# Patient Record
Sex: Male | Born: 1965 | Race: Black or African American | Hispanic: No | Marital: Married | Smoking: Current every day smoker
Health system: Southern US, Community
[De-identification: ages and names within clinical notes are randomized; demographics above are authoritative.]

## PROBLEM LIST (undated history)

## (undated) HISTORY — PX: TONSILLECTOMY: SUR1361

## (undated) HISTORY — PX: HIP SURGERY: SHX245

---

## 2000-02-29 ENCOUNTER — Emergency Department (HOSPITAL_COMMUNITY): Admission: EM | Admit: 2000-02-29 | Discharge: 2000-02-29 | Payer: Self-pay | Admitting: *Deleted

## 2007-09-09 ENCOUNTER — Emergency Department (HOSPITAL_COMMUNITY): Admission: EM | Admit: 2007-09-09 | Discharge: 2007-09-09 | Payer: Self-pay | Admitting: Emergency Medicine

## 2008-07-08 ENCOUNTER — Emergency Department (HOSPITAL_COMMUNITY): Admission: EM | Admit: 2008-07-08 | Discharge: 2008-07-09 | Payer: Self-pay | Admitting: Emergency Medicine

## 2009-01-14 ENCOUNTER — Emergency Department (HOSPITAL_COMMUNITY): Admission: EM | Admit: 2009-01-14 | Discharge: 2009-01-14 | Payer: Self-pay | Admitting: Emergency Medicine

## 2010-01-09 ENCOUNTER — Emergency Department (HOSPITAL_COMMUNITY): Admission: EM | Admit: 2010-01-09 | Discharge: 2009-03-25 | Payer: Self-pay | Admitting: Emergency Medicine

## 2010-03-15 ENCOUNTER — Emergency Department (HOSPITAL_COMMUNITY): Payer: Managed Care, Other (non HMO)

## 2010-03-15 ENCOUNTER — Emergency Department (HOSPITAL_COMMUNITY)
Admission: EM | Admit: 2010-03-15 | Discharge: 2010-03-15 | Disposition: A | Discharge status: Home / Self Care | Payer: Managed Care, Other (non HMO) | Attending: Emergency Medicine | Admitting: Emergency Medicine

## 2010-03-15 DIAGNOSIS — F17200 Nicotine dependence, unspecified, uncomplicated: Secondary | ICD-10-CM | POA: Insufficient documentation

## 2010-03-15 DIAGNOSIS — W2209XA Striking against other stationary object, initial encounter: Secondary | ICD-10-CM | POA: Insufficient documentation

## 2010-03-15 DIAGNOSIS — S92919A Unspecified fracture of unspecified toe(s), initial encounter for closed fracture: Secondary | ICD-10-CM | POA: Insufficient documentation

## 2010-12-15 ENCOUNTER — Emergency Department (HOSPITAL_COMMUNITY)
Admission: EM | Admit: 2010-12-15 | Discharge: 2010-12-15 | Disposition: A | Discharge status: Home / Self Care | Payer: Managed Care, Other (non HMO) | Attending: Emergency Medicine | Admitting: Emergency Medicine

## 2010-12-15 ENCOUNTER — Emergency Department (HOSPITAL_COMMUNITY): Payer: Managed Care, Other (non HMO)

## 2010-12-15 ENCOUNTER — Other Ambulatory Visit: Payer: Self-pay

## 2010-12-15 ENCOUNTER — Encounter: Payer: Self-pay | Admitting: Emergency Medicine

## 2010-12-15 DIAGNOSIS — R531 Weakness: Secondary | ICD-10-CM

## 2010-12-15 DIAGNOSIS — R51 Headache: Secondary | ICD-10-CM | POA: Insufficient documentation

## 2010-12-15 DIAGNOSIS — R5381 Other malaise: Secondary | ICD-10-CM | POA: Insufficient documentation

## 2010-12-15 DIAGNOSIS — F17200 Nicotine dependence, unspecified, uncomplicated: Secondary | ICD-10-CM | POA: Insufficient documentation

## 2010-12-15 DIAGNOSIS — R079 Chest pain, unspecified: Secondary | ICD-10-CM | POA: Insufficient documentation

## 2010-12-15 DIAGNOSIS — R110 Nausea: Secondary | ICD-10-CM | POA: Insufficient documentation

## 2010-12-15 LAB — BASIC METABOLIC PANEL
CO2: 26 mEq/L (ref 19–32)
Calcium: 9.2 mg/dL (ref 8.4–10.5)
Chloride: 106 mEq/L (ref 96–112)
Creatinine, Ser: 1.24 mg/dL (ref 0.50–1.35)
Glucose, Bld: 112 mg/dL — ABNORMAL HIGH (ref 70–99)
Potassium: 3.5 mEq/L (ref 3.5–5.1)

## 2010-12-15 LAB — CBC
HCT: 42.1 % (ref 39.0–52.0)
RBC: 4.52 MIL/uL (ref 4.22–5.81)
WBC: 5.2 10*3/uL (ref 4.0–10.5)

## 2010-12-15 MED ORDER — ASPIRIN 81 MG PO CHEW
324.0000 mg | CHEWABLE_TABLET | Freq: Once | ORAL | Status: AC
  Administered 2010-12-15: 324 mg via ORAL
  Filled 2010-12-15: qty 4

## 2010-12-15 NOTE — ED Notes (Signed)
Pt c/o feeling weakness and states his head/legs and chest feel funny.

## 2010-12-15 NOTE — ED Provider Notes (Signed)
History     CSN: [id number] Arrival date & time: 12/15/2010  6:42 PM   First MD Initiated Contact with Patient 12/15/10 1929      Chief Complaint  Patient presents with  . Chest Pain  . Weakness     Patient is a 45 y.o. male presenting with weakness. The history is provided by the patient and a relative.  Weakness The primary symptoms include nausea. Primary symptoms do not include headaches, syncope, loss of consciousness, altered mental status, seizures, dizziness, visual change, paresthesias, focal weakness, loss of sensation, speech change, memory loss, fever or vomiting. The symptoms began 6 to 12 hours ago. The symptoms are improving. The neurological symptoms are diffuse.  Additional symptoms include weakness.   Pt reports he was hunting today, and "legs felt heavy" but no syncope No focal weakness.  He felt mild nausea and diffuse weakness. No actual CP - report "chest feels funny" but is unable to fully describe this No SOB reported No vomiting He felt "clammy" Now feeling improved He is not on meds No recent vomiting/diarrhea/illnesses   PMH - none  History reviewed. No pertinent past surgical history.  History reviewed. No pertinent family history.  History  Substance Use Topics  . Smoking status: Current Everyday Smoker    Types: Cigarettes  . Smokeless tobacco: Not on file  . Alcohol Use: No      Review of Systems  Constitutional: Negative for fever.  Cardiovascular: Negative for syncope.  Gastrointestinal: Positive for nausea. Negative for vomiting.  Neurological: Positive for weakness. Negative for dizziness, speech change, focal weakness, seizures, loss of consciousness, headaches and paresthesias.  Psychiatric/Behavioral: Negative for memory loss and altered mental status.  All other systems reviewed and are negative.    Allergies  Penicillins  Home Medications  No current outpatient prescriptions on file.  BP 114/74  Pulse 75   Temp(Src) 97.5 F (36.4 C) (Oral)  Resp 20  SpO2 97% BP 118/74  Pulse 68  Temp(Src) 97.5 F (36.4 C) (Oral)  Resp 20  SpO2 98%   Physical Exam  CONSTITUTIONAL: Well developed/well nourished HEAD AND FACE: Normocephalic/atraumatic EYES: EOMI/PERRL ENMT: Mucous membranes moist NECK: supple no meningeal signs CV: S1/S2 noted, no murmurs/rubs/gallops noted LUNGS: Lungs are clear to auscultation bilaterally, no apparent distress ABDOMEN: soft, nontender, no rebound or guarding GU:no cva tenderness NEURO: Pt is awake/alert, moves all extremitiesx4.  Pt watching Tv, no distress Face symmetric, no arm/leg drift EXTREMITIES: pulses normal, full ROM SKIN: warm, color normal PSYCH: no abnormalities of mood noted    ED Course  Procedures   Labs Reviewed  POCT I-STAT TROPONIN I  CBC  BASIC METABOLIC PANEL  POCT CARDIAC MARKERS  PRO B NATRIURETIC PEPTIDE  CARDIAC PANEL(CRET KIN+CKTOT+MB+TROPI)   Dg Chest Port 1 View  12/15/2010  *RADIOLOGY REPORT*  Clinical Data: Chest pain.  Weakness.  Headache.  PORTABLE CHEST - 1 VIEW  Comparison: 03/25/2009.  Findings: Interval borderline enlargement of the cardiac silhouette, magnified by the poor inspiration and the portable AP technique.  Clear lungs with normal vascularity.  No significant change in mildly prominent interstitial markings.  Unremarkable bones.  IMPRESSION: Stable mild chronic interstitial lung disease.  No acute abnormality.  Original Report Authenticated By: Darrol Angel, M.D.     9:31 PM Pt with complaints of generalized weakness, now improved, no syncope.  No signs of CVA.  No significant CP reported.  9:56 PM I wanted to repeat his cardiac markers before discharge Pt refused to stay  for any further testing and wanted to leave immediately  MDM  Nursing notes reviewed and considered in documentation All labs/vitals reviewed and considered xrays reviewed and considered    Date: 12/15/2010  Rate: 60  Rhythm:  normal sinus rhythm  QRS Axis: normal  Intervals: normal  ST/T Wave abnormalities: normal  Conduction Disutrbances:none  Narrative Interpretation:   Old EKG Reviewed: none available at time of interpretation        Joya Gaskins, MD 12/15/10 2156

## 2011-02-11 ENCOUNTER — Emergency Department (HOSPITAL_COMMUNITY)
Admission: EM | Admit: 2011-02-11 | Discharge: 2011-02-12 | Disposition: A | Discharge status: Home / Self Care | Payer: Managed Care, Other (non HMO) | Attending: Emergency Medicine | Admitting: Emergency Medicine

## 2011-02-11 ENCOUNTER — Encounter (HOSPITAL_COMMUNITY): Payer: Self-pay | Admitting: *Deleted

## 2011-02-11 DIAGNOSIS — R112 Nausea with vomiting, unspecified: Secondary | ICD-10-CM | POA: Insufficient documentation

## 2011-02-11 DIAGNOSIS — R10816 Epigastric abdominal tenderness: Secondary | ICD-10-CM | POA: Insufficient documentation

## 2011-02-11 DIAGNOSIS — R109 Unspecified abdominal pain: Secondary | ICD-10-CM | POA: Insufficient documentation

## 2011-02-11 DIAGNOSIS — R197 Diarrhea, unspecified: Secondary | ICD-10-CM | POA: Insufficient documentation

## 2011-02-11 MED ORDER — PANTOPRAZOLE SODIUM 40 MG IV SOLR
40.0000 mg | Freq: Once | INTRAVENOUS | Status: AC
  Administered 2011-02-12: 40 mg via INTRAVENOUS
  Filled 2011-02-11: qty 40

## 2011-02-11 MED ORDER — SODIUM CHLORIDE 0.9 % IV BOLUS (SEPSIS)
1000.0000 mL | Freq: Once | INTRAVENOUS | Status: AC
  Administered 2011-02-12: 1000 mL via INTRAVENOUS

## 2011-02-11 MED ORDER — ONDANSETRON HCL 4 MG/2ML IJ SOLN
4.0000 mg | Freq: Once | INTRAMUSCULAR | Status: AC
Start: 2011-02-12 — End: 2011-02-12
  Administered 2011-02-12: 4 mg via INTRAVENOUS
  Filled 2011-02-11: qty 2

## 2011-02-11 NOTE — ED Notes (Signed)
Pt reports n/v/d starting yesterday 

## 2011-02-12 ENCOUNTER — Encounter (HOSPITAL_COMMUNITY): Payer: Self-pay | Admitting: Emergency Medicine

## 2011-02-12 LAB — DIFFERENTIAL
Basophils Relative: 0 % (ref 0–1)
Lymphs Abs: 0.8 10*3/uL (ref 0.7–4.0)
Monocytes Relative: 7 % (ref 3–12)
Neutro Abs: 6.7 10*3/uL (ref 1.7–7.7)
Neutrophils Relative %: 82 % — ABNORMAL HIGH (ref 43–77)

## 2011-02-12 LAB — COMPREHENSIVE METABOLIC PANEL
ALT: 33 U/L (ref 0–53)
CO2: 29 mEq/L (ref 19–32)
Calcium: 10 mg/dL (ref 8.4–10.5)
Chloride: 100 mEq/L (ref 96–112)
GFR calc Af Amer: 82 mL/min — ABNORMAL LOW (ref >90)
Potassium: 3.5 mEq/L (ref 3.5–5.1)
Sodium: 136 mEq/L (ref 135–145)
Total Bilirubin: 1.4 mg/dL — ABNORMAL HIGH (ref 0.3–1.2)

## 2011-02-12 LAB — CBC
MCH: 32.7 pg (ref 26.0–34.0)
Platelets: 161 10*3/uL (ref 150–400)

## 2011-02-12 LAB — LIPASE, BLOOD: Lipase: 14 U/L (ref 11–59)

## 2011-02-12 MED ORDER — ONDANSETRON HCL 8 MG PO TABS: 8.0000 mg | ORAL_TABLET | Freq: Four times a day (QID) | ORAL | Status: AC

## 2011-02-12 NOTE — ED Notes (Signed)
NS 1 liter complete at this time.

## 2011-02-12 NOTE — ED Provider Notes (Signed)
Medical screening examination/treatment/procedure(s) were performed by non-physician practitioner and as supervising physician I was immediately available for consultation/collaboration.   Lyanne Co, MD 02/12/11 [phone]

## 2011-02-12 NOTE — ED Provider Notes (Signed)
History     CSN: [id number]  Arrival date & time 02/11/11  2241   First MD Initiated Contact with Patient 02/11/11 2323      Chief Complaint  Patient presents with  . Abdominal Pain    (Consider location/radiation/quality/duration/timing/severity/associated sxs/prior treatment) HPI Comments: Patient c/o gradual onset of "crampy" upper abdominal pain that began last evening.  He later developed diarrhea and vomiting that has persisted today.  States that he only vomits when he tries to eat or drink.  States that co-workers also have similar symptoms.  He denies urinary symptoms, bloody diarrhea or vomitus, fever or chest pain  Patient is a 46 y.o. male presenting with abdominal pain. The history is provided by the patient. No language interpreter was used.  Abdominal Pain The primary symptoms of the illness include abdominal pain, nausea, vomiting and diarrhea. The primary symptoms of the illness do not include fever, shortness of breath, hematemesis or dysuria. The current episode started yesterday. The onset of the illness was gradual. The problem has not changed since onset. The abdominal pain began yesterday. The pain came on gradually. The abdominal pain has been gradually improving since its onset. The abdominal pain is located in the epigastric region. The abdominal pain does not radiate. The abdominal pain is relieved by vomiting and bowel movement. The abdominal pain is exacerbated by eating.  Nausea began yesterday. Associated with: nothing. The nausea is exacerbated by food.  The vomiting began yesterday. Vomiting occurs 2 to 5 times per day. The emesis contains stomach contents.  The diarrhea began yesterday. The diarrhea is watery. The diarrhea occurs 2 to 4 times per day.  Associated with: sick contacts. The patient has had a change in bowel habit. Symptoms associated with the illness do not include anorexia, diaphoresis, heartburn, constipation, urgency, hematuria, frequency or  back pain. Significant associated medical issues do not include PUD, GERD, inflammatory bowel disease, diabetes, gallstones, substance abuse or diverticulitis.    History reviewed. No pertinent past medical history.  Past Surgical History  Procedure Date  . Hip surgery     No family history on file.  History  Substance Use Topics  . Smoking status: Current Everyday Smoker    Types: Cigarettes  . Smokeless tobacco: Not on file  . Alcohol Use: No      Review of Systems  Constitutional: Negative for fever, diaphoresis, activity change and appetite change.  HENT: Negative for neck pain and neck stiffness.   Respiratory: Negative for chest tightness and shortness of breath.   Gastrointestinal: Positive for nausea, vomiting, abdominal pain and diarrhea. Negative for heartburn, constipation, blood in stool, anorexia and hematemesis.  Genitourinary: Negative for dysuria, urgency, frequency and hematuria.  Musculoskeletal: Negative for myalgias and back pain.  Skin: Negative.   Neurological: Negative for dizziness and headaches.  All other systems reviewed and are negative.    Allergies  Penicillins  Home Medications  No current outpatient prescriptions on file.  BP 119/70  Pulse 116  Temp(Src) 99.2 F (37.3 C) (Oral)  Resp 16  Ht 6\' 3"  (1.905 m)  Wt 225 lb 9.6 oz (102.331 kg)  BMI 28.20 kg/m2  SpO2 100%  Physical Exam  Nursing note and vitals reviewed. Constitutional: He is oriented to person, place, and time. He appears well-developed and well-nourished. No distress.  HENT:  Head: Normocephalic and atraumatic.  Mouth/Throat: Oropharynx is clear and moist.  Neck: Normal range of motion. Neck supple.  Cardiovascular: Normal rate, regular rhythm and normal heart sounds.  No murmur heard. Pulmonary/Chest: Effort normal and breath sounds normal. No respiratory distress.  Abdominal: Soft. He exhibits no distension and no mass. There is no hepatosplenomegaly. There is  tenderness in the epigastric area. There is no rigidity, no rebound, no guarding, no CVA tenderness and no tenderness at McBurney's point.  Musculoskeletal: He exhibits no edema and no tenderness.  Neurological: He is alert and oriented to person, place, and time. He exhibits normal muscle tone. Coordination normal.  Skin: Skin is warm and dry.    ED Course  Procedures (including critical care time)  Results for orders placed during the hospital encounter of 02/11/11  CBC      Component Value Range   WBC 8.1  4.0 - 10.5 (K/uL)   RBC 5.32  4.22 - 5.81 (MIL/uL)   Hemoglobin 17.4 (*) 13.0 - 17.0 (g/dL)   HCT 29.5  28.4 - 13.2 (%)   MCV 91.9  78.0 - 100.0 (fL)   MCH 32.7  26.0 - 34.0 (pg)   MCHC 35.6  30.0 - 36.0 (g/dL)   RDW 44.0  10.2 - 72.5 (%)   Platelets 161  150 - 400 (K/uL)  DIFFERENTIAL      Component Value Range   Neutrophils Relative 82 (*) 43 - 77 (%)   Neutro Abs 6.7  1.7 - 7.7 (K/uL)   Lymphocytes Relative 10 (*) 12 - 46 (%)   Lymphs Abs 0.8  0.7 - 4.0 (K/uL)   Monocytes Relative 7  3 - 12 (%)   Monocytes Absolute 0.6  0.1 - 1.0 (K/uL)   Eosinophils Relative 1  0 - 5 (%)   Eosinophils Absolute 0.1  0.0 - 0.7 (K/uL)   Basophils Relative 0  0 - 1 (%)   Basophils Absolute 0.0  0.0 - 0.1 (K/uL)  COMPREHENSIVE METABOLIC PANEL      Component Value Range   Sodium 136  135 - 145 (mEq/L)   Potassium 3.5  3.5 - 5.1 (mEq/L)   Chloride 100  96 - 112 (mEq/L)   CO2 29  19 - 32 (mEq/L)   Glucose, Bld 100 (*) 70 - 99 (mg/dL)   BUN 16  6 - 23 (mg/dL)   Creatinine, Ser 3.66  0.50 - 1.35 (mg/dL)   Calcium 44.0  8.4 - 10.5 (mg/dL)   Total Protein 7.4  6.0 - 8.3 (g/dL)   Albumin 4.2  3.5 - 5.2 (g/dL)   AST 25  0 - 37 (U/L)   ALT 33  0 - 53 (U/L)   Alkaline Phosphatase 87  39 - 117 (U/L)   Total Bilirubin 1.4 (*) 0.3 - 1.2 (mg/dL)   GFR calc non Af Amer 71 (*) >90 (mL/min)   GFR calc Af Amer 82 (*) >90 (mL/min)  LIPASE, BLOOD      Component Value Range   Lipase 14  11 - 59  (U/L)         MDM      Patient is alert, NAD.  Mild epigastric ttp w/o peritoneal signs.  Non-toxic appearing.    1:30 AM patient is feeling better.  Tolerating small amt of fluids.  abd remains soft.  Likely viral gastroenteritis . Vitals improved, tachycardia resolved after IVF's.  He agrees to f/u with his PMD for recheck or to return here if needed.    Pt feels improved after observation and/or treatment in ED.   Patient / Family / Caregiver understand and agree with initial ED impression and plan with expectations set  for ED visit.     Dontai Pember L. [place], [place] 02/12/11 [phone]

## 2011-08-17 ENCOUNTER — Emergency Department (HOSPITAL_COMMUNITY)
Admission: EM | Admit: 2011-08-17 | Discharge: 2011-08-17 | Disposition: A | Discharge status: Home / Self Care | Payer: Managed Care, Other (non HMO) | Attending: Emergency Medicine | Admitting: Emergency Medicine

## 2011-08-17 ENCOUNTER — Other Ambulatory Visit: Payer: Self-pay

## 2011-08-17 ENCOUNTER — Encounter (HOSPITAL_COMMUNITY): Payer: Self-pay | Admitting: *Deleted

## 2011-08-17 DIAGNOSIS — T678XXA Other effects of heat and light, initial encounter: Secondary | ICD-10-CM | POA: Insufficient documentation

## 2011-08-17 DIAGNOSIS — X30XXXA Exposure to excessive natural heat, initial encounter: Secondary | ICD-10-CM | POA: Insufficient documentation

## 2011-08-17 DIAGNOSIS — R5381 Other malaise: Secondary | ICD-10-CM | POA: Insufficient documentation

## 2011-08-17 DIAGNOSIS — R42 Dizziness and giddiness: Secondary | ICD-10-CM | POA: Insufficient documentation

## 2011-08-17 DIAGNOSIS — R079 Chest pain, unspecified: Secondary | ICD-10-CM | POA: Insufficient documentation

## 2011-08-17 DIAGNOSIS — F17200 Nicotine dependence, unspecified, uncomplicated: Secondary | ICD-10-CM | POA: Insufficient documentation

## 2011-08-17 DIAGNOSIS — T679XXA Effect of heat and light, unspecified, initial encounter: Secondary | ICD-10-CM

## 2011-08-17 LAB — POCT I-STAT, CHEM 8
Calcium, Ion: 1.22 mmol/L (ref 1.12–1.23)
Chloride: 107 mEq/L (ref 96–112)
HCT: 46 % (ref 39.0–52.0)
Sodium: 142 mEq/L (ref 135–145)

## 2011-08-17 MED ORDER — SODIUM CHLORIDE 0.9 % IV SOLN
INTRAVENOUS | Status: DC
  Administered 2011-08-17: 16:00:00 via INTRAVENOUS

## 2011-08-17 MED ORDER — SODIUM CHLORIDE 0.9 % IV BOLUS (SEPSIS)
1000.0000 mL | Freq: Once | INTRAVENOUS | Status: AC
  Administered 2011-08-17: 1000 mL via INTRAVENOUS

## 2011-08-17 MED ORDER — SODIUM CHLORIDE 0.9 % IV BOLUS (SEPSIS): 500.0000 mL | Freq: Once | INTRAVENOUS | Status: DC

## 2011-08-17 MED ORDER — ACETAMINOPHEN 325 MG PO TABS
650.0000 mg | ORAL_TABLET | Freq: Once | ORAL | Status: DC
  Filled 2011-08-17: qty 2

## 2011-08-17 NOTE — ED Notes (Signed)
Pt was chasing his dog, became very sob and hot.    Cont to feel hot and says he still feels sob. Denies chest pain.  Feels weak all over.Headache

## 2011-08-17 NOTE — ED Provider Notes (Signed)
History   This chart was scribed for Laray Anger, DO by Shari Heritage. The patient was seen in room APA01/APA01.     CSN: [id number]  Arrival date & time 08/17/11  1531   First MD Initiated Contact with Patient 08/17/11 1555      Chief Complaint  Patient presents with  . Shortness of Breath     HPI Pt was seen at 1600. MAXAMUS COLAO is a 46 y.o. male who presents to the Emergency Department complaining of gradual onset and persistence of constant heat exposure that began PTA.  Pt states his dog escaped his fencing in the yard and he was "chasing him around the neighborhood" for more than 1 hour in the hot sun.  States he felt "hot," "weak all over," SOB, "sweaty" with a dull headache.  States he is starting to "feel a little better" with sitting and resting in a cool environment.  Denies CP/palpitations, no abd pain, no N/V/D, no focal motor weakness, no tingling/numbness in extremities, no back pain, no visual changes, no LOC, no AMS/confusion.      History reviewed. No pertinent past medical history.  Past Surgical History  Procedure Date  . Hip surgery   . Tonsillectomy   . Hip surgery      History  Substance Use Topics  . Smoking status: Current Everyday Smoker    Types: Cigarettes  . Smokeless tobacco: Not on file  . Alcohol Use: No    Review of Systems ROS: Statement: All systems negative except as marked or noted in the HPI; Constitutional: Negative for fever and chills. +"felt hot"; ; Eyes: Negative for eye pain, redness and discharge. ; ; ENMT: Negative for ear pain, hoarseness, nasal congestion, sinus pressure and sore throat. ; ; Cardiovascular: Negative for chest pain, palpitations, and peripheral edema. ; ; Respiratory: +SOB. Negative for cough, wheezing and stridor. ; ; Gastrointestinal: Negative for nausea, vomiting, diarrhea, abdominal pain, blood in stool, hematemesis, jaundice and rectal bleeding. . ; ; Genitourinary: Negative for dysuria, flank pain  and hematuria. ; ; Musculoskeletal: Negative for back pain and neck pain. Negative for swelling and trauma.; ; Skin: +diaphoresis.  Negative for pruritus, rash, abrasions, blisters, bruising and skin lesion.; ; Neuro: +generalized weakness, headache. Negative for lightheadedness and neck stiffness. Negative for altered level of consciousness , altered mental status, extremity weakness, paresthesias, involuntary movement, seizure and syncope.     Allergies  Penicillins  Home Medications  No current outpatient prescriptions on file.  BP 114/83  Pulse 90  Temp 98.4 F (36.9 C) (Oral)  Resp 20  Ht 6\' 3"  (1.905 m)  Wt 225 lb (102.059 kg)  BMI 28.12 kg/m2  SpO2 94%  Physical Exam 1605: Physical examination:  Nursing notes reviewed; Vital signs and O2 SAT reviewed;  Constitutional: Well developed, Well nourished, In no acute distress; Head:  Normocephalic, atraumatic; Eyes: EOMI, PERRL, No scleral icterus; ENMT: Mouth and pharynx normal, Mucous membranes dry; Neck: Supple, Full range of motion, No lymphadenopathy; Cardiovascular: Regular rate and rhythm, No murmur, rub, or gallop; Respiratory: Breath sounds clear & equal bilaterally, No rales, rhonchi, wheezes.  Speaking full sentences with ease, Normal respiratory effort/excursion; Chest: Nontender, Movement normal; Abdomen: Soft, Nontender, Nondistended, Normal bowel sounds;; Extremities: Pulses normal, No tenderness, No edema, No calf edema or asymmetry.; Neuro: AA&Ox3, Major CN grossly intact.  Speech clear. No facial droop. No gross focal motor or sensory deficits in extremities.; Skin: Color normal, Warm, Dry, no rash.   ED Course  Procedures    MDM  MDM Reviewed: nursing note, vitals and previous chart Reviewed previous: ECG Interpretation: ECG and labs     Date: 08/17/2011  Rate: 92  Rhythm: normal sinus rhythm  QRS Axis: normal  Intervals: normal  ST/T Wave abnormalities: normal  Conduction Disutrbances:none  Narrative  Interpretation:   Old EKG Reviewed: unchanged; no significant changes from previous EKG dated 12/15/2010.  Results for orders placed during the hospital encounter of 08/17/11  POCT I-STAT, CHEM 8      Component Value Range   Sodium 142  135 - 145 mEq/L   Potassium 3.6  3.5 - 5.1 mEq/L   Chloride 107  96 - 112 mEq/L   BUN 17  6 - 23 mg/dL   Creatinine, Ser 1.61  0.50 - 1.35 mg/dL   Glucose, Bld 096 (*) 70 - 99 mg/dL   Calcium, Ion 0.45  4.09 - 1.23 mmol/L   TCO2 23  0 - 100 mmol/L   Hemoglobin 15.6  13.0 - 17.0 g/dL   HCT 81.1  91.4 - 78.2 %      6:17 PM:  Feels better after IVF and wants to go home now.  VS no longer orthostatic.  Has been tol PO well without N/V.  Has ambulated in the ED with steady gait, easy resps. Continues to mentate per baseline.  Dx testing d/w pt and family.  Questions answered.  Verb understanding, agreeable to d/c home with outpt f/u.      I personally performed the services described in this documentation, which was scribed in my presence. The recorded information has been reviewed and considered. Aldean Suddeth Allison Quarry, DO 08/21/11 0800

## 2013-03-21 ENCOUNTER — Encounter (HOSPITAL_COMMUNITY): Payer: Self-pay | Admitting: Emergency Medicine

## 2013-03-21 ENCOUNTER — Emergency Department (HOSPITAL_COMMUNITY): Payer: Managed Care, Other (non HMO)

## 2013-03-21 ENCOUNTER — Emergency Department (HOSPITAL_COMMUNITY)
Admission: EM | Admit: 2013-03-21 | Discharge: 2013-03-21 | Disposition: A | Discharge status: Home / Self Care | Payer: Managed Care, Other (non HMO) | Attending: Emergency Medicine | Admitting: Emergency Medicine

## 2013-03-21 DIAGNOSIS — W010XXA Fall on same level from slipping, tripping and stumbling without subsequent striking against object, initial encounter: Secondary | ICD-10-CM | POA: Insufficient documentation

## 2013-03-21 DIAGNOSIS — X500XXA Overexertion from strenuous movement or load, initial encounter: Secondary | ICD-10-CM | POA: Insufficient documentation

## 2013-03-21 DIAGNOSIS — S43401A Unspecified sprain of right shoulder joint, initial encounter: Secondary | ICD-10-CM

## 2013-03-21 DIAGNOSIS — Z880 Allergy status to penicillin: Secondary | ICD-10-CM | POA: Insufficient documentation

## 2013-03-21 DIAGNOSIS — Y9389 Activity, other specified: Secondary | ICD-10-CM | POA: Insufficient documentation

## 2013-03-21 DIAGNOSIS — IMO0002 Reserved for concepts with insufficient information to code with codable children: Secondary | ICD-10-CM | POA: Insufficient documentation

## 2013-03-21 DIAGNOSIS — Y929 Unspecified place or not applicable: Secondary | ICD-10-CM | POA: Insufficient documentation

## 2013-03-21 DIAGNOSIS — Z87891 Personal history of nicotine dependence: Secondary | ICD-10-CM | POA: Insufficient documentation

## 2013-03-21 MED ORDER — NAPROXEN 500 MG PO TABS
500.0000 mg | ORAL_TABLET | Freq: Two times a day (BID) | ORAL | Status: DC
Start: 2013-03-21 — End: 2014-03-30

## 2013-03-21 MED ORDER — HYDROCODONE-ACETAMINOPHEN 5-325 MG PO TABS
1.0000 | ORAL_TABLET | ORAL | Status: DC | PRN
Start: 2013-03-21 — End: 2015-03-25

## 2013-03-21 NOTE — ED Notes (Signed)
Per patient fell this morning while playing with dog in snow. Patient c/o right shoulder pain that radiates down right arm. Patient reports feeling a "pop." Patient reports burning in shoulder and tingling from elbow to hand.

## 2013-03-21 NOTE — ED Notes (Signed)
Fell today in snow,  Pain rt shoulder and elbow, Landed on rt elbow.  Good radial pulse,  Numb sensation to forearm and hand.  Able to move elbow. But will not try to move shoulder.  Burning pain in shoulder. No HI or other injuries.  Alert , NAD

## 2013-03-21 NOTE — Discharge Instructions (Signed)
Shoulder Sprain A shoulder sprain is the result of damage to the tough, fiber-like tissues (ligaments) that help hold your shoulder in place. The ligaments may be stretched or torn. Besides the main shoulder joint (the ball and socket), there are several smaller joints that connect the bones in this area. A sprain usually involves one of those joints. Most often it is the acromioclavicular (or AC) joint. That is the joint that connects the collarbone (clavicle) and the shoulder blade (scapula) at the top point of the shoulder blade (acromion).  This could also be a cartilage injury as discussed. A shoulder sprain is a mild form of what is called a shoulder separation. Recovering from a shoulder sprain may take some time. For some, pain lingers for several months. Most people recover without long term problems. CAUSES   A shoulder sprain is usually caused by some kind of trauma. This might be:  Falling on an outstretched arm.  Being hit hard on the shoulder.  Twisting the arm.  Shoulder sprains are more likely to occur in people who:  Play sports.  Have balance or coordination problems. SYMPTOMS   Pain when you move your shoulder.  Limited ability to move the shoulder.  Swelling and tenderness on top of the shoulder.  Redness or warmth in the shoulder.  Bruising.  A change in the shape of the shoulder. DIAGNOSIS  Your healthcare provider may:  Ask about your symptoms.  Ask about recent activity that might have caused those symptoms.  Examine your shoulder. You may be asked to do simple exercises to test movement. The other shoulder will be examined for comparison.  Order some tests that provide a look inside the body. They can show the extent of the injury. The tests could include:  X-rays.  CT (computed tomography) scan.  MRI (magnetic resonance imaging) scan. RISKS AND COMPLICATIONS  Loss of full shoulder motion.  Ongoing shoulder pain. TREATMENT  How long it  takes to recover from a shoulder sprain depends on how severe it was. Treatment options may include:  Rest. You should not use the arm or shoulder until it heals.  Ice. For 2 or 3 days after the injury, put an ice pack on the shoulder up to 4 times a day. It should stay on for 15 to 20 minutes each time. Wrap the ice in a towel so it does not touch your skin.  Over-the-counter medicine to relieve pain.  A sling or brace. This will keep the arm still while the shoulder is healing.  Physical therapy or rehabilitation exercises. These will help you regain strength and motion. Ask your healthcare provider when it is OK to begin these exercises.  Surgery. The need for surgery is rare with a sprained shoulder, but some people may need surgery to keep the joint in place and reduce pain. HOME CARE INSTRUCTIONS   Ask your healthcare provider about what you should and should not do while your shoulder heals.  Make sure you know how to apply ice to the correct area of your shoulder.  Talk with your healthcare provider about which medications should be used for pain and swelling.  If rehabilitation therapy will be needed, ask your healthcare provider to refer you to a therapist. If it is not recommended, then ask about at-home exercises. Find out when exercise should begin. SEEK MEDICAL CARE IF:  Your pain, swelling, or redness at the joint increases. SEEK IMMEDIATE MEDICAL CARE IF:   You have a fever.  You  cannot move your arm or shoulder. Document Released: 06/07/2008 Document Revised: 04/13/2011 Document Reviewed: 06/07/2008 [facility] Patient Information 2014 [place], [place].   Call Dr. Hilda Lias for an appointment this week for recheck of your shoulder.  As discussed if your symptoms persist you may need an MRI to better define soft tissue injury such as ligaments or cartilage injuries.  Use the sling for comfort, use ice for the next 2 days as much as possible, you may add a heating pad  starting on Thursday.  Do not drive within 4 to taking hydrocodone as this medication will make you drowsy.

## 2013-03-25 NOTE — ED Provider Notes (Signed)
CSN: [id number]     Arrival date & time 03/21/13  1126 History   First MD Initiated Contact with Patient 03/21/13 1151     Chief Complaint  Patient presents with  . Fall     (Consider location/radiation/quality/duration/timing/severity/associated sxs/prior Treatment) HPI Comments: Justin Skinner is a 48 y.o. Male presenting with right shoulder pain after slipping in the snow early this morning and landing on his right forearm with elbow bent,  Causing sudden onset of pain and a popping sensation at his anterior shoulder.  He denies numbness but had tingling from his elbow to his lateral hand when the incident first occurred, now resolved. He does have a persistent burning sensation at the site of shoulder pain which is worsened with movement and palpation.  He has found no alleviators.  He denies neck or back pain and denies hitting his head during the fall.      The history is provided by the patient.    History reviewed. No pertinent past medical history. Past Surgical History  Procedure Laterality Date  . Hip surgery    . Tonsillectomy    . Hip surgery     Family History  Problem Relation Age of Onset  . Diabetes Father   . Hypertension Father   . Cancer Other    History  Substance Use Topics  . Smoking status: Former Smoker -- 1.00 packs/day for 25 years    Types: Cigarettes    Quit date: 09/16/2012  . Smokeless tobacco: Never Used  . Alcohol Use: No    Review of Systems  Constitutional: Negative for fever.  Musculoskeletal: Positive for arthralgias. Negative for joint swelling and myalgias.  Neurological: Negative for weakness and numbness.      Allergies  Penicillins  Home Medications   Current Outpatient Rx  Name  Route  Sig  Dispense  Refill  . HYDROcodone-acetaminophen (NORCO/VICODIN) 5-325 MG per tablet   Oral   Take 1 tablet by mouth every 4 (four) hours as needed for moderate pain.   15 tablet   0   . naproxen (NAPROSYN) 500 MG tablet   Oral   Take 1 tablet (500 mg total) by mouth 2 (two) times daily.   20 tablet   0    BP 131/77  Pulse 79  Temp(Src) 97.9 F (36.6 C) (Oral)  Resp 16  Ht 6\' 3"  (1.905 m)  Wt 226 lb 12.8 oz (102.876 kg)  BMI 28.35 kg/m2  SpO2 98% Physical Exam  Constitutional: He appears well-developed and well-nourished.  HENT:  Head: Atraumatic.  Neck: Normal range of motion.  Cardiovascular:  Pulses:      Radial pulses are 2+ on the right side, and 2+ on the left side.  Pulses equal bilaterally  Musculoskeletal: He exhibits tenderness. He exhibits no edema.       Right shoulder: He exhibits decreased range of motion and bony tenderness. He exhibits no swelling, no effusion, no crepitus, no deformity, no spasm and normal pulse.  ttp right anterior proximal humerus. No deformity, edema, ecchymosis. Increased pain with active and passive shoulder flexion and abduction, no crepitus with rom.  Elbow nontender. No tenderness or deformity along humerus shaft and forearm.   Neurological: He is alert. He has normal strength. He displays normal reflexes. No sensory deficit.  Equal grip strength  Skin: Skin is warm and dry.  Psychiatric: He has a normal mood and affect.    ED Course  Procedures (including critical care time) Labs Review Labs  Reviewed - No data to display Imaging Review No results found.  EKG Interpretation   None       MDM   Final diagnoses:  Sprain of shoulder, right   Patients labs and/or radiological studies were viewed and considered during the medical decision making and disposition process. Pt was encouraged rest, ice x 48 hours prn, sling provided, hydrocodone and naproxen prescribed.  F/u with ortho if pain not improved over the next week,  Referral given.  The patient appears reasonably screened and/or stabilized for discharge and I doubt any other medical condition or other [facility] requiring further screening, evaluation, or treatment in the ED at this time prior to  discharge.     Burgess AmorJulie Aveion Nguyen, PA-C 03/25/13 2318

## 2013-03-26 NOTE — ED Provider Notes (Signed)
Medical screening examination/treatment/procedure(s) were performed by non-physician practitioner and as supervising physician I was immediately available for consultation/collaboration.     Jarrid Lienhard, MD 03/26/13 0153 

## 2013-12-12 ENCOUNTER — Emergency Department (HOSPITAL_COMMUNITY)
Admission: EM | Admit: 2013-12-12 | Discharge: 2013-12-12 | Disposition: A | Discharge status: Home / Self Care | Payer: Managed Care, Other (non HMO) | Attending: Emergency Medicine | Admitting: Emergency Medicine

## 2013-12-12 ENCOUNTER — Emergency Department (HOSPITAL_COMMUNITY): Payer: Managed Care, Other (non HMO)

## 2013-12-12 ENCOUNTER — Encounter (HOSPITAL_COMMUNITY): Payer: Self-pay

## 2013-12-12 DIAGNOSIS — Z87891 Personal history of nicotine dependence: Secondary | ICD-10-CM | POA: Insufficient documentation

## 2013-12-12 DIAGNOSIS — R531 Weakness: Secondary | ICD-10-CM | POA: Diagnosis present

## 2013-12-12 DIAGNOSIS — Z880 Allergy status to penicillin: Secondary | ICD-10-CM | POA: Diagnosis not present

## 2013-12-12 DIAGNOSIS — Z791 Long term (current) use of non-steroidal anti-inflammatories (NSAID): Secondary | ICD-10-CM | POA: Diagnosis not present

## 2013-12-12 LAB — URINALYSIS, ROUTINE W REFLEX MICROSCOPIC
Bilirubin Urine: NEGATIVE
Glucose, UA: NEGATIVE mg/dL
Hgb urine dipstick: NEGATIVE
Ketones, ur: NEGATIVE mg/dL
LEUKOCYTES UA: NEGATIVE
NITRITE: NEGATIVE
PH: 7 (ref 5.0–8.0)
Protein, ur: NEGATIVE mg/dL
SPECIFIC GRAVITY, URINE: 1.01 (ref 1.005–1.030)
UROBILINOGEN UA: 0.2 mg/dL (ref 0.0–1.0)

## 2013-12-12 LAB — COMPREHENSIVE METABOLIC PANEL
ALBUMIN: 4 g/dL (ref 3.5–5.2)
ALK PHOS: 91 U/L (ref 39–117)
ALT: 51 U/L (ref 0–53)
ANION GAP: 11 (ref 5–15)
AST: 31 U/L (ref 0–37)
BILIRUBIN TOTAL: 0.4 mg/dL (ref 0.3–1.2)
BUN: 13 mg/dL (ref 6–23)
CHLORIDE: 101 meq/L (ref 96–112)
CO2: 26 mEq/L (ref 19–32)
CREATININE: 1.1 mg/dL (ref 0.50–1.35)
Calcium: 8.7 mg/dL (ref 8.4–10.5)
GFR calc non Af Amer: 79 mL/min — ABNORMAL LOW (ref >90)
GLUCOSE: 129 mg/dL — AB (ref 70–99)
Potassium: 3.9 mEq/L (ref 3.7–5.3)
Sodium: 138 mEq/L (ref 137–147)
Total Protein: 6.9 g/dL (ref 6.0–8.3)

## 2013-12-12 LAB — CBC WITH DIFFERENTIAL/PLATELET
BASOS ABS: 0 10*3/uL (ref 0.0–0.1)
BASOS PCT: 0 % (ref 0–1)
Eosinophils Absolute: 0.1 10*3/uL (ref 0.0–0.7)
Eosinophils Relative: 1 % (ref 0–5)
HEMATOCRIT: 43 % (ref 39.0–52.0)
Hemoglobin: 15.2 g/dL (ref 13.0–17.0)
LYMPHS PCT: 32 % (ref 12–46)
Lymphs Abs: 1.6 10*3/uL (ref 0.7–4.0)
MCH: 32.5 pg (ref 26.0–34.0)
MCHC: 35.3 g/dL (ref 30.0–36.0)
MCV: 91.9 fL (ref 78.0–100.0)
MONO ABS: 0.3 10*3/uL (ref 0.1–1.0)
Monocytes Relative: 6 % (ref 3–12)
NEUTROS ABS: 3.1 10*3/uL (ref 1.7–7.7)
NEUTROS PCT: 61 % (ref 43–77)
PLATELETS: 164 10*3/uL (ref 150–400)
RBC: 4.68 MIL/uL (ref 4.22–5.81)
RDW: 12.5 % (ref 11.5–15.5)
WBC: 5 10*3/uL (ref 4.0–10.5)

## 2013-12-12 LAB — TSH: TSH: 1.96 u[IU]/mL (ref 0.350–4.500)

## 2013-12-12 MED ORDER — SODIUM CHLORIDE 0.9 % IV BOLUS (SEPSIS)
1000.0000 mL | Freq: Once | INTRAVENOUS | Status: AC
  Administered 2013-12-12: 1000 mL via INTRAVENOUS

## 2013-12-12 NOTE — ED Notes (Signed)
Patient states that he feels bad, his legs and arms are cramping per pt.

## 2013-12-12 NOTE — ED Provider Notes (Signed)
CSN: [id number]     Arrival date & time 12/12/13  0008 History   First MD Initiated Contact with Patient 12/12/13 0026     Chief Complaint  Patient presents with  . Weakness     (Consider location/radiation/quality/duration/timing/severity/associated sxs/prior Treatment) HPI....generalized weakness since Saturday morning. 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 Also complains of cramping in his arms and legs. No chest pain, dyspnea, fever, chills, dysuria, diaphoresis. He normally feels well. Nothing makes symptoms better or worse. He has no chronic health problems. Nonsmoker. Nondrinker.  History reviewed. No pertinent past medical history. Past Surgical History  Procedure Laterality Date  . Hip surgery    . Tonsillectomy    . Hip surgery     Family History  Problem Relation Age of Onset  . Diabetes Father   . Hypertension Father   . Cancer Other    History  Substance Use Topics  . Smoking status: Former Smoker -- 1.00 packs/day for 25 years    Types: Cigarettes    Quit date: 09/16/2012  . Smokeless tobacco: Never Used  . Alcohol Use: No    Review of Systems  All other systems reviewed and are negative.     Allergies  Penicillins  Home Medications   Prior to Admission medications   Medication Sig Start Date End Date Taking? Authorizing Provider  HYDROcodone-acetaminophen (NORCO/VICODIN) 5-325 MG per tablet Take 1 tablet by mouth every 4 (four) hours as needed for moderate pain. 03/21/13   Burgess AmorJulie Idol, PA-C  naproxen (NAPROSYN) 500 MG tablet Take 1 tablet (500 mg total) by mouth 2 (two) times daily. 03/21/13   Burgess AmorJulie Idol, PA-C   BP 112/80 mmHg  Pulse 66  Temp(Src) 97.8 F (36.6 C) (Oral)  Resp 20  Ht 6\' 2"  (1.88 m)  Wt 226 lb (102.513 kg)  BMI 29.00 kg/m2  SpO2 98% Physical Exam  Constitutional: He is oriented to person, place, and time. He appears well-developed and well-nourished.  HENT:  Head: Normocephalic and atraumatic.  Eyes: Conjunctivae and  EOM are normal. Pupils are equal, round, and reactive to light.  Neck: Normal range of motion. Neck supple.  Cardiovascular: Normal rate, regular rhythm and normal heart sounds.   Pulmonary/Chest: Effort normal and breath sounds normal.  Abdominal: Soft. Bowel sounds are normal.  Musculoskeletal: Normal range of motion.  Neurological: He is alert and oriented to person, place, and time.  Skin: Skin is warm and dry.  Psychiatric: He has a normal mood and affect. His behavior is normal.  Nursing note and vitals reviewed.   ED Course  Procedures (including critical care time) Labs Review Labs Reviewed  COMPREHENSIVE METABOLIC PANEL - Abnormal; Notable for the following:    Glucose, Bld 129 (*)    GFR calc non Af Amer 79 (*)    All other components within normal limits  CBC WITH DIFFERENTIAL  URINALYSIS, ROUTINE W REFLEX MICROSCOPIC  TSH    Imaging Review Dg Chest 2 View  12/12/2013   CLINICAL DATA:  Generalized weakness, several days duration. Burning and pain in the chest and upper extremities.  EXAM: CHEST  2 VIEW  COMPARISON:  12/15/2010  FINDINGS: Heart size is normal. Mediastinal shadows are normal. The lungs are clear. No bronchial thickening. No infiltrate, mass, effusion or collapse. Pulmonary vascularity is normal. No bony abnormality.  IMPRESSION: Normal chest   Electronically Signed   By: Paulina FusiMark  Shogry M.D.   On: 12/12/2013 01:02     EKG Interpretation None  MDM   Final diagnoses:  Weakness    Patient does not appear toxic. Screening labs, chest x-ray normal. Discussed with patient.    Donnetta HutchingBrian Rosemond Lyttle, MD 12/12/13 [phone]

## 2013-12-12 NOTE — Discharge Instructions (Signed)
Tests show no life-threatening problem. Follow-up with your primary care doctor.

## 2014-03-30 ENCOUNTER — Encounter (HOSPITAL_COMMUNITY): Payer: Self-pay

## 2014-03-30 ENCOUNTER — Emergency Department (HOSPITAL_COMMUNITY): Payer: Managed Care, Other (non HMO)

## 2014-03-30 ENCOUNTER — Emergency Department (HOSPITAL_COMMUNITY)
Admission: EM | Admit: 2014-03-30 | Discharge: 2014-03-30 | Disposition: A | Discharge status: Home / Self Care | Payer: Managed Care, Other (non HMO) | Attending: Emergency Medicine | Admitting: Emergency Medicine

## 2014-03-30 DIAGNOSIS — Y998 Other external cause status: Secondary | ICD-10-CM | POA: Insufficient documentation

## 2014-03-30 DIAGNOSIS — Z791 Long term (current) use of non-steroidal anti-inflammatories (NSAID): Secondary | ICD-10-CM | POA: Diagnosis not present

## 2014-03-30 DIAGNOSIS — S299XXA Unspecified injury of thorax, initial encounter: Secondary | ICD-10-CM | POA: Insufficient documentation

## 2014-03-30 DIAGNOSIS — Z720 Tobacco use: Secondary | ICD-10-CM | POA: Insufficient documentation

## 2014-03-30 DIAGNOSIS — Z880 Allergy status to penicillin: Secondary | ICD-10-CM | POA: Diagnosis not present

## 2014-03-30 DIAGNOSIS — Y9289 Other specified places as the place of occurrence of the external cause: Secondary | ICD-10-CM | POA: Insufficient documentation

## 2014-03-30 DIAGNOSIS — Z79899 Other long term (current) drug therapy: Secondary | ICD-10-CM | POA: Diagnosis not present

## 2014-03-30 DIAGNOSIS — X58XXXA Exposure to other specified factors, initial encounter: Secondary | ICD-10-CM | POA: Insufficient documentation

## 2014-03-30 DIAGNOSIS — Y9389 Activity, other specified: Secondary | ICD-10-CM | POA: Insufficient documentation

## 2014-03-30 DIAGNOSIS — R0789 Other chest pain: Secondary | ICD-10-CM

## 2014-03-30 MED ORDER — ORPHENADRINE CITRATE ER 100 MG PO TB12: 100.0000 mg | ORAL_TABLET | Freq: Two times a day (BID) | ORAL | Status: DC

## 2014-03-30 MED ORDER — NAPROXEN 500 MG PO TABS: 500.0000 mg | ORAL_TABLET | Freq: Two times a day (BID) | ORAL | Status: DC

## 2014-03-30 MED ORDER — OXYCODONE-ACETAMINOPHEN 5-325 MG PO TABS: 1.0000 | ORAL_TABLET | ORAL | Status: DC | PRN

## 2014-03-30 NOTE — Discharge Instructions (Signed)
Wear the rib belt as needed.  Chest Wall Pain Chest wall pain is pain in or around the bones and muscles of your chest. It may take up to 6 weeks to get better. It may take longer if you must stay physically active in your work and activities.  CAUSES  Chest wall pain may happen on its own. However, it may be caused by:  A viral illness like the flu.  Injury.  Coughing.  Exercise.  Arthritis.  Fibromyalgia.  Shingles. HOME CARE INSTRUCTIONS   Avoid overtiring physical activity. Try not to strain or perform activities that cause pain. This includes any activities using your chest or your abdominal and side muscles, especially if heavy weights are used.  Put ice on the sore area.  Put ice in a plastic bag.  Place a towel between your skin and the bag.  Leave the ice on for 15-20 minutes per hour while awake for the first 2 days.  Only take over-the-counter or prescription medicines for pain, discomfort, or fever as directed by your caregiver. SEEK IMMEDIATE MEDICAL CARE IF:   Your pain increases, or you are very uncomfortable.  You have a fever.  Your chest pain becomes worse.  You have new, unexplained symptoms.  You have nausea or vomiting.  You feel sweaty or lightheaded.  You have a cough with phlegm (sputum), or you cough up blood. MAKE SURE YOU:   Understand these instructions.  Will watch your condition.  Will get help right away if you are not doing well or get worse. Document Released: 01/19/2005 Document Revised: 04/13/2011 Document Reviewed: 09/15/2010 [facility] Patient Information 2015 [place], [place]. This information is not intended to replace advice given to you by your health care provider. Make sure you discuss any questions you have with your health care provider.  Naproxen and naproxen sodium oral immediate-release tablets What is this medicine? NAPROXEN (na PROX en) is a non-steroidal anti-inflammatory drug (NSAID). It is used to reduce  swelling and to treat pain. This medicine may be used for dental pain, headache, or painful monthly periods. It is also used for painful joint and muscular problems such as arthritis, tendinitis, bursitis, and gout. This medicine may be used for other purposes; ask your health care provider or pharmacist if you have questions. COMMON BRAND NAME(S): Aflaxen, Aleve, Aleve Arthritis, All Day Relief, Anaprox, Anaprox DS, Naprosyn What should I tell my health care provider before I take this medicine? They need to know if you have any of these conditions: -asthma -cigarette smoker -drink more than 3 alcohol containing drinks a day -heart disease or circulation problems such as heart failure or leg edema (fluid retention) -high blood pressure -kidney disease -liver disease -stomach bleeding or ulcers -an unusual or allergic reaction to naproxen, aspirin, other NSAIDs, other medicines, foods, dyes, or preservatives -pregnant or trying to get pregnant -breast-feeding How should I use this medicine? Take this medicine by mouth with a glass of water. Follow the directions on the prescription label. Take it with food if your stomach gets upset. Try to not lie down for at least 10 minutes after you take it. Take your medicine at regular intervals. Do not take your medicine more often than directed. Long-term, continuous use may increase the risk of heart attack or stroke. A special MedGuide will be given to you by the pharmacist with each prescription and refill. Be sure to read this information carefully each time. Talk to your pediatrician regarding the use of this medicine in  children. Special care may be needed. Overdosage: If you think you have taken too much of this medicine contact a poison control center or emergency room at once. NOTE: This medicine is only for you. Do not share this medicine with others. What if I miss a dose? If you miss a dose, take it as soon as you can. If it is almost time  for your next dose, take only that dose. Do not take double or extra doses. What may interact with this medicine? -alcohol -aspirin -cidofovir -diuretics -lithium -methotrexate -other drugs for inflammation like ketorolac or prednisone -pemetrexed -probenecid -warfarin This list may not describe all possible interactions. Give your health care provider a list of all the medicines, herbs, non-prescription drugs, or dietary supplements you use. Also tell them if you smoke, drink alcohol, or use illegal drugs. Some items may interact with your medicine. What should I watch for while using this medicine? Tell your doctor or health care professional if your pain does not get better. Talk to your doctor before taking another medicine for pain. Do not treat yourself. This medicine does not prevent heart attack or stroke. In fact, this medicine may increase the chance of a heart attack or stroke. The chance may increase with longer use of this medicine and in people who have heart disease. If you take aspirin to prevent heart attack or stroke, talk with your doctor or health care professional. Do not take other medicines that contain aspirin, ibuprofen, or naproxen with this medicine. Side effects such as stomach upset, nausea, or ulcers may be more likely to occur. Many medicines available without a prescription should not be taken with this medicine. This medicine can cause ulcers and bleeding in the stomach and intestines at any time during treatment. Do not smoke cigarettes or drink alcohol. These increase irritation to your stomach and can make it more susceptible to damage from this medicine. Ulcers and bleeding can happen without warning symptoms and can cause death. You may get drowsy or dizzy. Do not drive, use machinery, or do anything that needs mental alertness until you know how this medicine affects you. Do not stand or sit up quickly, especially if you are an older patient. This reduces the  risk of dizzy or fainting spells. This medicine can cause you to bleed more easily. Try to avoid damage to your teeth and gums when you brush or floss your teeth. What side effects may I notice from receiving this medicine? Side effects that you should report to your doctor or health care professional as soon as possible: -black or bloody stools, blood in the urine or vomit -blurred vision -chest pain -difficulty breathing or wheezing -nausea or vomiting -severe stomach pain -skin rash, skin redness, blistering or peeling skin, hives, or itching -slurred speech or weakness on one side of the body -swelling of eyelids, throat, lips -unexplained weight gain or swelling -unusually weak or tired -yellowing of eyes or skin Side effects that usually do not require medical attention (report to your doctor or health care professional if they continue or are bothersome): -constipation -headache -heartburn This list may not describe all possible side effects. Call your doctor for medical advice about side effects. You may report side effects to FDA at 1-800-FDA-1088. Where should I keep my medicine? Keep out of the reach of children. Store at room temperature between 15 and 30 degrees C (59 and 86 degrees F). Keep container tightly closed. Throw away any unused medicine after the expiration date.  NOTE: This sheet is a summary. It may not cover all possible information. If you have questions about this medicine, talk to your doctor, pharmacist, or health care provider.  2015, Elsevier/Gold Standard. (2009-01-21 20:10:16)  Orphenadrine tablets What is this medicine? ORPHENADRINE (or FEN a dreen) helps to relieve pain and stiffness in muscles and can treat muscle spasms. This medicine may be used for other purposes; ask your health care provider or pharmacist if you have questions. COMMON BRAND NAME(S): Norflex What should I tell my health care provider before I take this medicine? They need to  know if you have any of these conditions: -glaucoma -heart disease -kidney disease -myasthenia gravis -peptic ulcer disease -prostate disease -stomach problems -an unusual or allergic reaction to orphenadrine, other medicines, foods, lactose, dyes, or preservatives -pregnant or trying to get pregnant -breast-feeding How should I use this medicine? Take this medicine by mouth with a full glass of water. Follow the directions on the prescription label. Take your medicine at regular intervals. Do not take your medicine more often than directed. Do not take more than you are told to take. Talk to your pediatrician regarding the use of this medicine in children. Special care may be needed. Patients over 16 years old may have a stronger reaction and need a smaller dose. Overdosage: If you think you have taken too much of this medicine contact a poison control center or emergency room at once. NOTE: This medicine is only for you. Do not share this medicine with others. What if I miss a dose? If you miss a dose, take it as soon as you can. If it is almost time for your next dose, take only that dose. Do not take double or extra doses. What may interact with this medicine? -alcohol -antihistamines -barbiturates, like phenobarbital -benzodiazepines -cyclobenzaprine -medicines for pain -phenothiazines like chlorpromazine, mesoridazine, prochlorperazine, thioridazine This list may not describe all possible interactions. Give your health care provider a list of all the medicines, herbs, non-prescription drugs, or dietary supplements you use. Also tell them if you smoke, drink alcohol, or use illegal drugs. Some items may interact with your medicine. What should I watch for while using this medicine? Your mouth may get dry. Chewing sugarless gum or sucking hard candy, and drinking plenty of water may help. Contact your doctor if the problem does not go away or is severe. This medicine may cause dry  eyes and blurred vision. If you wear contact lenses you may feel some discomfort. Lubricating drops may help. See your eye doctor if the problem does not go away or is severe. You may get drowsy or dizzy. Do not drive, use machinery, or do anything that needs mental alertness until you know how this medicine affects you. Do not stand or sit up quickly, especially if you are an older patient. This reduces the risk of dizzy or fainting spells. Alcohol may interfere with the effect of this medicine. Avoid alcoholic drinks. What side effects may I notice from receiving this medicine? Side effects that you should report to your doctor or health care professional as soon as possible: -allergic reactions like skin rash, itching or hives, swelling of the face, lips, or tongue -changes in vision -difficulty breathing -fast heartbeat or palpitations -hallucinations -light headedness, fainting spells -vomiting Side effects that usually do not require medical attention (report to your doctor or health care professional if they continue or are bothersome): -dizziness -drowsiness -headache -nausea This list may not describe all possible side effects. Call  your doctor for medical advice about side effects. You may report side effects to FDA at 1-800-FDA-1088. Where should I keep my medicine? Keep out of the reach of children. Store at room temperature between 15 and 30 degrees C (59 and 86 degrees F). Protect from light. Keep container tightly closed. Throw away any unused medicine after the expiration date. NOTE: This sheet is a summary. It may not cover all possible information. If you have questions about this medicine, talk to your doctor, pharmacist, or health care provider.  2015, Elsevier/Gold Standard. (2007-08-16 17:19:12)  Acetaminophen; Oxycodone tablets What is this medicine? ACETAMINOPHEN; OXYCODONE (a set a MEE noe fen; ox i KOE done) is a pain reliever. It is used to treat mild to moderate  pain. This medicine may be used for other purposes; ask your health care provider or pharmacist if you have questions. COMMON BRAND NAME(S): Endocet, Magnacet, Narvox, Percocet, Perloxx, Primalev, Primlev, Roxicet, Xolox What should I tell my health care provider before I take this medicine? They need to know if you have any of these conditions: -brain tumor -Crohn's disease, inflammatory bowel disease, or ulcerative colitis -drug abuse or addiction -head injury -heart or circulation problems -if you often drink alcohol -kidney disease or problems going to the bathroom -liver disease -lung disease, asthma, or breathing problems -an unusual or allergic reaction to acetaminophen, oxycodone, other opioid analgesics, other medicines, foods, dyes, or preservatives -pregnant or trying to get pregnant -breast-feeding How should I use this medicine? Take this medicine by mouth with a full glass of water. Follow the directions on the prescription label. Take your medicine at regular intervals. Do not take your medicine more often than directed. Talk to your pediatrician regarding the use of this medicine in children. Special care may be needed. Patients over 47 years old may have a stronger reaction and need a smaller dose. Overdosage: If you think you have taken too much of this medicine contact a poison control center or emergency room at once. NOTE: This medicine is only for you. Do not share this medicine with others. What if I miss a dose? If you miss a dose, take it as soon as you can. If it is almost time for your next dose, take only that dose. Do not take double or extra doses. What may interact with this medicine? -alcohol -antihistamines -barbiturates like amobarbital, butalbital, butabarbital, methohexital, pentobarbital, phenobarbital, thiopental, and secobarbital -benztropine -drugs for bladder problems like solifenacin, trospium, oxybutynin, tolterodine, hyoscyamine, and  methscopolamine -drugs for breathing problems like ipratropium and tiotropium -drugs for certain stomach or intestine problems like propantheline, homatropine methylbromide, glycopyrrolate, atropine, belladonna, and dicyclomine -general anesthetics like etomidate, ketamine, nitrous oxide, propofol, desflurane, enflurane, halothane, isoflurane, and sevoflurane -medicines for depression, anxiety, or psychotic disturbances -medicines for sleep -muscle relaxants -naltrexone -narcotic medicines (opiates) for pain -phenothiazines like perphenazine, thioridazine, chlorpromazine, mesoridazine, fluphenazine, prochlorperazine, promazine, and trifluoperazine -scopolamine -tramadol -trihexyphenidyl This list may not describe all possible interactions. Give your health care provider a list of all the medicines, herbs, non-prescription drugs, or dietary supplements you use. Also tell them if you smoke, drink alcohol, or use illegal drugs. Some items may interact with your medicine. What should I watch for while using this medicine? Tell your doctor or health care professional if your pain does not go away, if it gets worse, or if you have new or a different type of pain. You may develop tolerance to the medicine. Tolerance means that you will need a higher dose of the  medication for pain relief. Tolerance is normal and is expected if you take this medicine for a long time. Do not suddenly stop taking your medicine because you may develop a severe reaction. Your body becomes used to the medicine. This does NOT mean you are addicted. Addiction is a behavior related to getting and using a drug for a non-medical reason. If you have pain, you have a medical reason to take pain medicine. Your doctor will tell you how much medicine to take. If your doctor wants you to stop the medicine, the dose will be slowly lowered over time to avoid any side effects. You may get drowsy or dizzy. Do not drive, use machinery, or do  anything that needs mental alertness until you know how this medicine affects you. Do not stand or sit up quickly, especially if you are an older patient. This reduces the risk of dizzy or fainting spells. Alcohol may interfere with the effect of this medicine. Avoid alcoholic drinks. There are different types of narcotic medicines (opiates) for pain. If you take more than one type at the same time, you may have more side effects. Give your health care provider a list of all medicines you use. Your doctor will tell you how much medicine to take. Do not take more medicine than directed. Call emergency for help if you have problems breathing. The medicine will cause constipation. Try to have a bowel movement at least every 2 to 3 days. If you do not have a bowel movement for 3 days, call your doctor or health care professional. Do not take Tylenol (acetaminophen) or medicines that have acetaminophen with this medicine. Too much acetaminophen can be very dangerous. Many nonprescription medicines contain acetaminophen. Always read the labels carefully to avoid taking more acetaminophen. What side effects may I notice from receiving this medicine? Side effects that you should report to your doctor or health care professional as soon as possible: -allergic reactions like skin rash, itching or hives, swelling of the face, lips, or tongue -breathing difficulties, wheezing -confusion -light headedness or fainting spells -severe stomach pain -unusually weak or tired -yellowing of the skin or the whites of the eyes Side effects that usually do not require medical attention (report to your doctor or health care professional if they continue or are bothersome): -dizziness -drowsiness -nausea -vomiting This list may not describe all possible side effects. Call your doctor for medical advice about side effects. You may report side effects to FDA at 1-800-FDA-1088. Where should I keep my medicine? Keep out of  the reach of children. This medicine can be abused. Keep your medicine in a safe place to protect it from theft. Do not share this medicine with anyone. Selling or giving away this medicine is dangerous and against the law. Store at room temperature between 20 and 25 degrees C (68 and 77 degrees F). Keep container tightly closed. Protect from light. This medicine may cause accidental overdose and death if it is taken by other adults, children, or pets. Flush any unused medicine down the toilet to reduce the chance of harm. Do not use the medicine after the expiration date. NOTE: This sheet is a summary. It may not cover all possible information. If you have questions about this medicine, talk to your doctor, pharmacist, or health care provider.  2015, Elsevier/Gold Standard. (2012-09-12 13:17:35)

## 2014-03-30 NOTE — ED Notes (Signed)
Pt states 2 weeks ago he was reaching for something up high at work and felt a pop to right lower ribcage.  This am he thinks he rolled over in bed and felt another pop to the same area and is having severe pain

## 2014-03-30 NOTE — ED Provider Notes (Signed)
CSN: [id number]     Arrival date & time 03/30/14  0443 History   First MD Initiated Contact with Patient 03/30/14 [phone]     Chief Complaint  Patient presents with  . Rib Injury     (Consider location/radiation/quality/duration/timing/severity/associated sxs/prior Treatment) The history is provided by the patient.   49 year old male injured his right cage at work 2 weeks ago. He was supporting himself on a pallet not reaching up and felt something pop in his right lateral lower rib cage. He didn't think much of it but pain has not gone away. This morning, he rolled over in bed and felt the same area pop and pain is been much worse since then. He rates pain at 8/10. Pain is worse with movement and slightly worse with breathing. There is no associated dyspnea or cough and there is no nausea or vomiting. He has been taking BC for pain.  History reviewed. No pertinent past medical history. Past Surgical History  Procedure Laterality Date  . Hip surgery    . Tonsillectomy    . Hip surgery     Family History  Problem Relation Age of Onset  . Diabetes Father   . Hypertension Father   . Cancer Other    History  Substance Use Topics  . Smoking status: Current Some Day Smoker -- 1.00 packs/day for 25 years    Types: Cigarettes    Last Attempt to Quit: 09/16/2012  . Smokeless tobacco: Never Used  . Alcohol Use: No    Review of Systems  All other systems reviewed and are negative.     Allergies  Penicillins  Home Medications   Prior to Admission medications   Medication Sig Start Date End Date Taking? Authorizing Provider  HYDROcodone-acetaminophen (NORCO/VICODIN) 5-325 MG per tablet Take 1 tablet by mouth every 4 (four) hours as needed for moderate pain. 03/21/13   Burgess Amor, PA-C  naproxen (NAPROSYN) 500 MG tablet Take 1 tablet (500 mg total) by mouth 2 (two) times daily. 03/21/13   Burgess Amor, PA-C   BP 127/91 mmHg  Pulse 73  Temp(Src) 97.5 F (36.4 C) (Oral)  Resp 17  Ht   (1.905 m)  Wt 240 lb (108.863 kg)  BMI 30.00 kg/m2  SpO2 98% Physical Exam  Nursing note and vitals reviewed.  49 year old male, resting comfortably and in no acute distress. Vital signs are significant for borderline hypertension. Oxygen saturation is 98%, which is normal. Head is normocephalic and atraumatic. PERRLA, EOMI. Oropharynx is clear. Neck is nontender and supple without adenopathy or JVD. Back is nontender and there is no CVA tenderness. Lungs are clear without rales, wheezes, or rhonchi. Chest is very tender in the right lower lateral and anterolateral chest wall. No crepitus present. No point tenderness.Marland Kitchen Heart has regular rate and rhythm without murmur. Abdomen is soft, flat, nontender without masses or hepatosplenomegaly and peristalsis is normoactive. Extremities have no cyanosis or edema, full range of motion is present. Skin is warm and dry without rash. Neurologic: Mental status is normal, cranial nerves are intact, there are no motor or sensory deficits.  ED Course  Procedures (including critical care time)  Imaging Review Dg Ribs Unilateral W/chest Right  03/30/2014   CLINICAL DATA:  Right rib pain after injury.  Heard a pop.  EXAM: RIGHT RIBS AND CHEST - 3+ VIEW  COMPARISON:  12/12/2013  FINDINGS: The cortical margins of the right ribs are intact. No fracture or destructive rib lesion. The lungs are clear.  Cardiomediastinal contours are normal. No pleural effusion or pneumothorax.  IMPRESSION: Intact right ribs, no fracture.   Electronically Signed   By: Rubye OaksMelanie  Ehinger M.D.   On: 03/30/2014 05:52     MDM   Final diagnoses:  Right-sided chest wall pain    Chest wall injury. He will be sent for rib x-rays.  X-rays are negative for fracture. Patient has been wearing an amp revised rib belt and states it does seem to get him relief. A rib L is applied and he is discharged with prescriptions for naproxen and oxycodone-acetaminophen. He states that it  feels like his muscles are going into spasm, so he is also given a prescription for orphenadrine. Work release is given for 24 hours.  Dione Boozeavid Ayodele Hartsock, MD 03/30/14 [phone]

## 2015-03-25 ENCOUNTER — Emergency Department (HOSPITAL_COMMUNITY): Payer: Managed Care, Other (non HMO)

## 2015-03-25 ENCOUNTER — Encounter (HOSPITAL_COMMUNITY): Payer: Self-pay | Admitting: *Deleted

## 2015-03-25 ENCOUNTER — Emergency Department (HOSPITAL_COMMUNITY)
Admission: EM | Admit: 2015-03-25 | Discharge: 2015-03-26 | Disposition: A | Discharge status: Home / Self Care | Payer: Managed Care, Other (non HMO) | Attending: Emergency Medicine | Admitting: Emergency Medicine

## 2015-03-25 DIAGNOSIS — J111 Influenza due to unidentified influenza virus with other respiratory manifestations: Secondary | ICD-10-CM | POA: Diagnosis not present

## 2015-03-25 DIAGNOSIS — F17210 Nicotine dependence, cigarettes, uncomplicated: Secondary | ICD-10-CM | POA: Diagnosis not present

## 2015-03-25 DIAGNOSIS — R509 Fever, unspecified: Secondary | ICD-10-CM | POA: Diagnosis present

## 2015-03-25 DIAGNOSIS — Z880 Allergy status to penicillin: Secondary | ICD-10-CM | POA: Diagnosis not present

## 2015-03-25 LAB — LIPASE, BLOOD: Lipase: 21 U/L (ref 11–51)

## 2015-03-25 LAB — COMPREHENSIVE METABOLIC PANEL
ALBUMIN: 4.2 g/dL (ref 3.5–5.0)
ALK PHOS: 75 U/L (ref 38–126)
ALT: 49 U/L (ref 17–63)
AST: 32 U/L (ref 15–41)
Anion gap: 7 (ref 5–15)
BILIRUBIN TOTAL: 0.6 mg/dL (ref 0.3–1.2)
BUN: 12 mg/dL (ref 6–20)
CO2: 27 mmol/L (ref 22–32)
CREATININE: 1.32 mg/dL — AB (ref 0.61–1.24)
Calcium: 8.5 mg/dL — ABNORMAL LOW (ref 8.9–10.3)
Chloride: 105 mmol/L (ref 101–111)
GFR calc Af Amer: >60 mL/min (ref >60)
GFR calc non Af Amer: >60 mL/min (ref >60)
GLUCOSE: 103 mg/dL — AB (ref 65–99)
POTASSIUM: 4 mmol/L (ref 3.5–5.1)
Sodium: 139 mmol/L (ref 135–145)
TOTAL PROTEIN: 6.9 g/dL (ref 6.5–8.1)

## 2015-03-25 LAB — CBC
HEMATOCRIT: 46.8 % (ref 39.0–52.0)
Hemoglobin: 16.3 g/dL (ref 13.0–17.0)
MCH: 32.5 pg (ref 26.0–34.0)
MCHC: 34.8 g/dL (ref 30.0–36.0)
MCV: 93.4 fL (ref 78.0–100.0)
PLATELETS: 134 10*3/uL — AB (ref 150–400)
RBC: 5.01 MIL/uL (ref 4.22–5.81)
RDW: 12.8 % (ref 11.5–15.5)
WBC: 3.8 10*3/uL — ABNORMAL LOW (ref 4.0–10.5)

## 2015-03-25 LAB — URINALYSIS, ROUTINE W REFLEX MICROSCOPIC
Bilirubin Urine: NEGATIVE
Glucose, UA: NEGATIVE mg/dL
Hgb urine dipstick: NEGATIVE
Ketones, ur: NEGATIVE mg/dL
Leukocytes, UA: NEGATIVE
Nitrite: NEGATIVE
Protein, ur: NEGATIVE mg/dL
Specific Gravity, Urine: 1.025 (ref 1.005–1.030)
pH: 5.5 (ref 5.0–8.0)

## 2015-03-25 MED ORDER — KETOROLAC TROMETHAMINE 30 MG/ML IJ SOLN
30.0000 mg | Freq: Once | INTRAMUSCULAR | Status: AC
  Administered 2015-03-25: 30 mg via INTRAVENOUS
  Filled 2015-03-25: qty 1

## 2015-03-25 MED ORDER — ACETAMINOPHEN 325 MG PO TABS
650.0000 mg | ORAL_TABLET | Freq: Once | ORAL | Status: AC
  Administered 2015-03-25: 650 mg via ORAL
  Filled 2015-03-25: qty 2

## 2015-03-25 MED ORDER — ONDANSETRON HCL 4 MG/2ML IJ SOLN
4.0000 mg | Freq: Once | INTRAMUSCULAR | Status: AC
  Administered 2015-03-25: 4 mg via INTRAVENOUS
  Filled 2015-03-25: qty 2

## 2015-03-25 MED ORDER — MORPHINE SULFATE (PF) 4 MG/ML IV SOLN
4.0000 mg | INTRAVENOUS | Status: DC | PRN
  Administered 2015-03-25: 4 mg via INTRAVENOUS
  Filled 2015-03-25: qty 1

## 2015-03-25 MED ORDER — SODIUM CHLORIDE 0.9 % IV BOLUS (SEPSIS)
1000.0000 mL | Freq: Once | INTRAVENOUS | Status: AC
  Administered 2015-03-25: 1000 mL via INTRAVENOUS

## 2015-03-25 NOTE — ED Notes (Signed)
Abdominal pain began over the weekend. Chills. Cough. Sore throat. Headache.

## 2015-03-26 MED ORDER — NAPROXEN 500 MG PO TABS
500.0000 mg | ORAL_TABLET | Freq: Two times a day (BID) | ORAL | Status: DC
Start: 2015-03-26 — End: 2018-07-16

## 2015-03-26 MED ORDER — ONDANSETRON 4 MG PO TBDP: 4.0000 mg | ORAL_TABLET | Freq: Three times a day (TID) | ORAL | Status: DC | PRN

## 2015-03-26 MED ORDER — BENZONATATE 100 MG PO CAPS: 100.0000 mg | ORAL_CAPSULE | Freq: Three times a day (TID) | ORAL | Status: DC

## 2015-03-26 NOTE — Discharge Instructions (Signed)

## 2015-03-26 NOTE — ED Notes (Signed)
Pt states understanding of care given and follow up instructions.  Ambulated from ED with significant other 

## 2015-03-26 NOTE — ED Provider Notes (Signed)
CSN: [id number]     Arrival date & time 03/25/15  1758 History   First MD Initiated Contact with Patient 03/25/15 1954     Chief Complaint  Patient presents with  . Abdominal Pain     HPI  Patient presents evaluation of fever. States she's had a headache and dry sore throat over the week and return as developed a cough and chills. He denies abdominal pain. States she's has a poor appetite and nausea.  Did not receive a flu vaccine. Nausea no vomiting no diarrhea. His main complaint is body aches joint pain and headache. He does not have stiff neck. Has not had a rash.  History reviewed. No pertinent past medical history. Past Surgical History  Procedure Laterality Date  . Hip surgery    . Tonsillectomy    . Hip surgery     Family History  Problem Relation Age of Onset  . Diabetes Father   . Hypertension Father   . Cancer Other    Social History  Substance Use Topics  . Smoking status: Current Some Day Smoker -- 1.00 packs/day for 25 years    Types: Cigarettes    Last Attempt to Quit: 09/16/2012  . Smokeless tobacco: Never Used  . Alcohol Use: No    Review of Systems  Constitutional: Positive for fever. Negative for chills, diaphoresis, appetite change and fatigue.  HENT: Negative for mouth sores, sore throat and trouble swallowing.   Eyes: Negative for visual disturbance.  Respiratory: Positive for cough. Negative for chest tightness, shortness of breath and wheezing.   Cardiovascular: Negative for chest pain.  Gastrointestinal: Positive for nausea and abdominal pain. Negative for vomiting, diarrhea and abdominal distention.  Endocrine: Negative for polydipsia, polyphagia and polyuria.  Genitourinary: Negative for dysuria, frequency and hematuria.  Musculoskeletal: Positive for myalgias. Negative for gait problem.  Skin: Negative for color change, pallor and rash.  Neurological: Negative for dizziness, syncope, light-headedness and headaches.  Hematological: Does not  bruise/bleed easily.  Psychiatric/Behavioral: Negative for behavioral problems and confusion.      Allergies  Penicillins  Home Medications   Prior to Admission medications   Medication Sig Start Date End Date Taking? Authorizing Provider  acetaminophen (TYLENOL) 500 MG tablet Take 500 mg by mouth every 6 (six) hours as needed for mild pain or moderate pain.   Yes Historical Provider, MD  benzonatate (TESSALON) 100 MG capsule Take 1 capsule (100 mg total) by mouth every 8 (eight) hours. 03/26/15   Rolland Porter, MD  naproxen (NAPROSYN) 500 MG tablet Take 1 tablet (500 mg total) by mouth 2 (two) times daily. 03/26/15   Rolland Porter, MD  ondansetron (ZOFRAN ODT) 4 MG disintegrating tablet Take 1 tablet (4 mg total) by mouth every 8 (eight) hours as needed for nausea. 03/26/15   Rolland Porter, MD   BP 93/62 mmHg  Pulse 75  Temp(Src) 99.9 F (37.7 C) (Oral)  Resp 18  Ht  (1.905 m)  Wt 225 lb (102.059 kg)  BMI 28.12 kg/m2  SpO2 94% Physical Exam  Constitutional: He is oriented to person, place, and time. He appears well-developed and well-nourished. No distress.  HENT:  Head: Normocephalic.  Conjunctival injection. Normal pharynx.  Eyes: Conjunctivae are normal. Pupils are equal, round, and reactive to light. No scleral icterus.  Neck: Normal range of motion. Neck supple. No thyromegaly present.  Range of motion of the neck normal  Cardiovascular: Normal rate and regular rhythm.  Exam reveals no gallop and no friction rub.  No murmur heard. Pulmonary/Chest: Effort normal and breath sounds normal. No respiratory distress. He has no wheezes. He has no rales.  Clear lungs. No wheezing rales rhonchi or prolongation  Abdominal: Soft. Bowel sounds are normal. He exhibits no distension. There is no tenderness. There is no rebound.  Musculoskeletal: Normal range of motion.  Neurological: He is alert and oriented to person, place, and time.  Skin: Skin is warm and dry. No rash noted.   Psychiatric: He has a normal mood and affect. His behavior is normal.    ED Course  Procedures (including critical care time) Labs Review Labs Reviewed  COMPREHENSIVE METABOLIC PANEL - Abnormal; Notable for the following:    Glucose, Bld 103 (*)    Creatinine, Ser 1.32 (*)    Calcium 8.5 (*)    All other components within normal limits  CBC - Abnormal; Notable for the following:    WBC 3.8 (*)    Platelets 134 (*)    All other components within normal limits  LIPASE, BLOOD  URINALYSIS, ROUTINE W REFLEX MICROSCOPIC (NOT AT [facility])    Imaging Review Dg Chest 2 View  03/25/2015  CLINICAL DATA:  Right-sided chest pain, abdominal pain, fever shortness of breath 3 days. EXAM: CHEST  2 VIEW COMPARISON:  12/12/2013 FINDINGS: The cardiac silhouette, mediastinal hilar contours are within normal limits and stable. There are bronchitic lung changes with peribronchial thickening and increased interstitial markings. These appear relatively stable and could be due to smoking. Could not exclude superimposed bronchitis. No infiltrates or effusions. The bony thorax is intact. IMPRESSION: Chronic appearing bronchitic type interstitial lung changes, likely related to smoking. Could not exclude superimposed bronchitis. No focal infiltrates. Electronically Signed   By: Rudie Meyer M.D.   On: 03/25/2015 21:20   I have personally reviewed and evaluated these images and lab results as part of my medical decision-making.   EKG Interpretation None      MDM   Final diagnoses:  Influenza   Additional studies show no signs of localized bacterial or separate infection. Hee is appropriate for outpatient treatment.  Plan supportive care is symptomatically treatment rest antipyretics anti-inflammatories fluids.    Rolland Porter, MD 03/26/15 [phone]

## 2015-09-13 ENCOUNTER — Emergency Department (HOSPITAL_COMMUNITY): Payer: Managed Care, Other (non HMO)

## 2015-09-13 ENCOUNTER — Emergency Department (HOSPITAL_COMMUNITY)
Admission: EM | Admit: 2015-09-13 | Discharge: 2015-09-13 | Disposition: A | Discharge status: Home / Self Care | Payer: Managed Care, Other (non HMO) | Attending: Emergency Medicine | Admitting: Emergency Medicine

## 2015-09-13 ENCOUNTER — Encounter (HOSPITAL_COMMUNITY): Payer: Self-pay

## 2015-09-13 DIAGNOSIS — Y929 Unspecified place or not applicable: Secondary | ICD-10-CM | POA: Diagnosis not present

## 2015-09-13 DIAGNOSIS — Z23 Encounter for immunization: Secondary | ICD-10-CM | POA: Diagnosis not present

## 2015-09-13 DIAGNOSIS — W540XXA Bitten by dog, initial encounter: Secondary | ICD-10-CM | POA: Diagnosis not present

## 2015-09-13 DIAGNOSIS — F17210 Nicotine dependence, cigarettes, uncomplicated: Secondary | ICD-10-CM | POA: Insufficient documentation

## 2015-09-13 DIAGNOSIS — S61251A Open bite of left index finger without damage to nail, initial encounter: Secondary | ICD-10-CM | POA: Insufficient documentation

## 2015-09-13 DIAGNOSIS — S61052A Open bite of left thumb without damage to nail, initial encounter: Secondary | ICD-10-CM | POA: Insufficient documentation

## 2015-09-13 DIAGNOSIS — Y939 Activity, unspecified: Secondary | ICD-10-CM | POA: Diagnosis not present

## 2015-09-13 DIAGNOSIS — Z79899 Other long term (current) drug therapy: Secondary | ICD-10-CM | POA: Diagnosis not present

## 2015-09-13 DIAGNOSIS — S61252A Open bite of right middle finger without damage to nail, initial encounter: Secondary | ICD-10-CM | POA: Insufficient documentation

## 2015-09-13 DIAGNOSIS — Y999 Unspecified external cause status: Secondary | ICD-10-CM | POA: Insufficient documentation

## 2015-09-13 MED ORDER — HYDROCODONE-ACETAMINOPHEN 5-325 MG PO TABS
1.0000 | ORAL_TABLET | ORAL | 0 refills | Status: DC | PRN
Start: 2015-09-13 — End: 2018-07-16

## 2015-09-13 MED ORDER — PROMETHAZINE HCL 12.5 MG PO TABS: 25.0000 mg | ORAL_TABLET | Freq: Once | ORAL | Status: DC

## 2015-09-13 MED ORDER — MORPHINE SULFATE (PF) 4 MG/ML IV SOLN
4.0000 mg | Freq: Once | INTRAVENOUS | Status: DC
Start: 2015-09-13 — End: 2015-09-13

## 2015-09-13 MED ORDER — TETANUS-DIPHTH-ACELL PERTUSSIS 5-2.5-18.5 LF-MCG/0.5 IM SUSP
0.5000 mL | Freq: Once | INTRAMUSCULAR | Status: AC
  Administered 2015-09-13: 0.5 mL via INTRAMUSCULAR
  Filled 2015-09-13: qty 0.5

## 2015-09-13 MED ORDER — DOXYCYCLINE HYCLATE 100 MG PO CAPS: 100.0000 mg | ORAL_CAPSULE | Freq: Two times a day (BID) | ORAL | 0 refills | Status: DC

## 2015-09-13 NOTE — ED Provider Notes (Signed)
[facility]-EMERGENCY DEPT Provider Note   CSN: [id number] Arrival date & time: 09/13/15  2019  First Provider Contact:  None       History   Chief Complaint Chief Complaint  Patient presents with  . Animal Bite    HPI Justin Skinner is a 50 y.o. male.  Patient is a 50 year old male who presents to the emergency department with animal bites to both hands.  The patient states that his dog got a hold of something that she should not head, and he was attempting to take it out of the dog's mouth. The patient sustained a bite to the right middle finger on. He also sustained a bite to the left thumb on an index finger. The patient is unsure of the date of his last tetanus. He denies being on any anticoagulation medications. He's not had any previous operations or procedures involving the left or right hand. Movement and palpation cause increased pain. Resting the hands and elevating them make the pain improved.    Animal Bite    History reviewed. No pertinent past medical history.  There are no active problems to display for this patient.   Past Surgical History:  Procedure Laterality Date  . HIP SURGERY    . HIP SURGERY    . TONSILLECTOMY         Home Medications    Prior to Admission medications   Medication Sig Start Date End Date Taking? Authorizing Provider  acetaminophen (TYLENOL) 500 MG tablet Take 500 mg by mouth every 6 (six) hours as needed for mild pain or moderate pain.    Historical Provider, MD  benzonatate (TESSALON) 100 MG capsule Take 1 capsule (100 mg total) by mouth every 8 (eight) hours. 03/26/15   Rolland Porter, MD  naproxen (NAPROSYN) 500 MG tablet Take 1 tablet (500 mg total) by mouth 2 (two) times daily. 03/26/15   Rolland Porter, MD  ondansetron (ZOFRAN ODT) 4 MG disintegrating tablet Take 1 tablet (4 mg total) by mouth every 8 (eight) hours as needed for nausea. 03/26/15   Rolland Porter, MD    Family History Family History  Problem Relation Age of Onset  .  Diabetes Father   . Hypertension Father   . Cancer Other     Social History Social History  Substance Use Topics  . Smoking status: Current Some Day Smoker    Packs/day: 1.00    Years: 25.00    Types: Cigarettes    Last attempt to quit: 09/16/2012  . Smokeless tobacco: Never Used  . Alcohol use No     Allergies   Penicillins   Review of Systems Review of Systems  Constitutional: Negative for activity change.       All ROS Neg except as noted in HPI  HENT: Negative for nosebleeds.   Eyes: Negative for photophobia and discharge.  Respiratory: Negative for cough, shortness of breath and wheezing.   Cardiovascular: Negative for chest pain and palpitations.  Gastrointestinal: Negative for abdominal pain and blood in stool.  Genitourinary: Negative for dysuria, frequency and hematuria.  Musculoskeletal: Negative for arthralgias, back pain and neck pain.  Skin: Negative.   Neurological: Negative for dizziness, seizures and speech difficulty.  Psychiatric/Behavioral: Negative for confusion and hallucinations.     Physical Exam Updated Vital Signs BP 156/77 (BP Location: Right Arm)   Pulse 82   Temp 98.2 F (36.8 C) (Oral)   Resp 16   Ht  (1.905 m)   Wt 106.6 kg  SpO2 99%   BMI 29.37 kg/m   Physical Exam  Constitutional: He is oriented to person, place, and time. He appears well-developed and well-nourished.  Non-toxic appearance.  HENT:  Head: Normocephalic.  Right Ear: Tympanic membrane and external ear normal.  Left Ear: Tympanic membrane and external ear normal.  Eyes: EOM and lids are normal. Pupils are equal, round, and reactive to light.  Neck: Normal range of motion. Neck supple. Carotid bruit is not present.  Cardiovascular: Normal rate, regular rhythm, normal heart sounds, intact distal pulses and normal pulses.   Pulmonary/Chest: Breath sounds normal. No respiratory distress.  Abdominal: Soft. Bowel sounds are normal. There is no tenderness. There  is no guarding.  Musculoskeletal: Normal range of motion.  1.6 cm jagged laceration of the left thumb at the dorsal surface. The laceration is near the MP joint. There is a shallow laceration at the distal portion of the thumb, and a shallow laceration of the distal portion of the left index finger. There is good range of motion of all fingers and wrist and elbow of the right and left upper extremities. There is a laceration at the cuticle area of the distal right middle finger and a puncture wound on the palmar surface of the right middle finger. The radial pulses are 2+. The capillary refill is less than 2 seconds.  Lymphadenopathy:       Head (right side): No submandibular adenopathy present.       Head (left side): No submandibular adenopathy present.    He has no cervical adenopathy.  Neurological: He is alert and oriented to person, place, and time. He has normal strength. No cranial nerve deficit or sensory deficit.  Skin: Skin is warm and dry.  Psychiatric: He has a normal mood and affect. His speech is normal.  Nursing note and vitals reviewed.    ED Treatments / Results  Labs (all labs ordered are listed, but only abnormal results are displayed) Labs Reviewed - No data to display  EKG  EKG Interpretation None       Radiology No results found.  Procedures Procedures (including critical care time)  Medications Ordered in ED Medications - No data to display   Initial Impression / Assessment and Plan / ED   I have reviewed the triage vital signs and the nursing notes.  Pertinent labs & imaging results that were available during my care of the patient were reviewed by me and considered in my medical decision making (see chart for details).  Clinical Course    *I have reviewed nursing notes, vital signs, and all appropriate lab and imaging results for this patient.**  Final Clinical Impressions(s) / ED Diagnoses   Patient sustained lacerations from dog bite to the  left hand. The wounds were cleansed thoroughly. Sterile dressing was applied to the right middle finger, the left thumb, and the index finger. We discussed the patient's tetanus status. The patient will be placed on doxycycline. He is to return to the emergency department immediately if any signs of infection. The patient is certain of the dogs rabies status and shots. The incident was reported to animal control.    Final diagnoses:  None    New Prescriptions New Prescriptions   No medications on file     Ivery QualeHobson Urbano Milhouse, Cordelia Poche-C 09/16/15 2217    Cathren LaineKevin Steinl, MD 09/19/15 1409

## 2015-09-13 NOTE — Discharge Instructions (Addendum)
Please cleanse the wounds daily with soap and water. Allow them to heal from the inside out. Please change the dressings daily. Use doxycycline 2 times daily with food until all taken. Please return if signs of infection or other problems.

## 2015-09-13 NOTE — ED Triage Notes (Signed)
My dog bit me on my left thumb and right middle finger.  Rabies shot is up to date until late 2018.

## 2018-01-25 IMAGING — DX DG FINGER MIDDLE 2+V*R*
3 series · 3 of 3 positions shown · non-contrast
Comparison: None.

CLINICAL DATA: Initial evaluation for acute trauma, dog bite.

EXAM:
RIGHT MIDDLE FINGER 2+V

[finger ap]
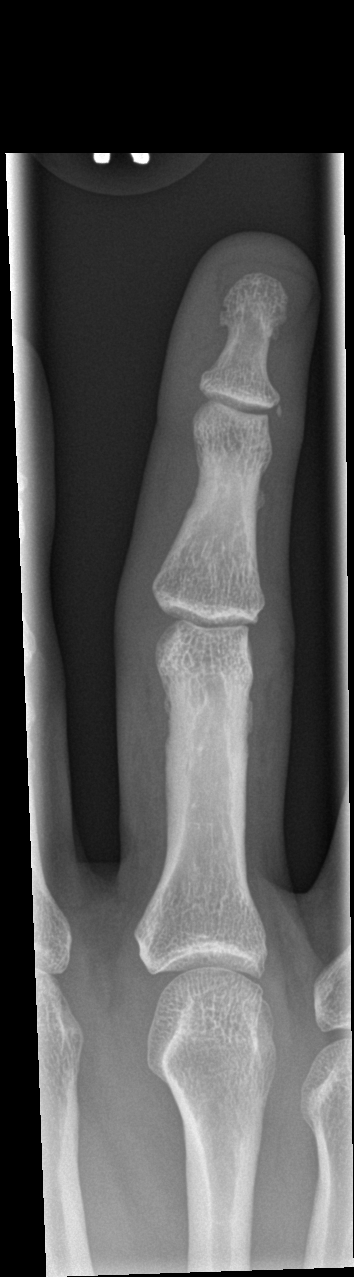

[finger obl]
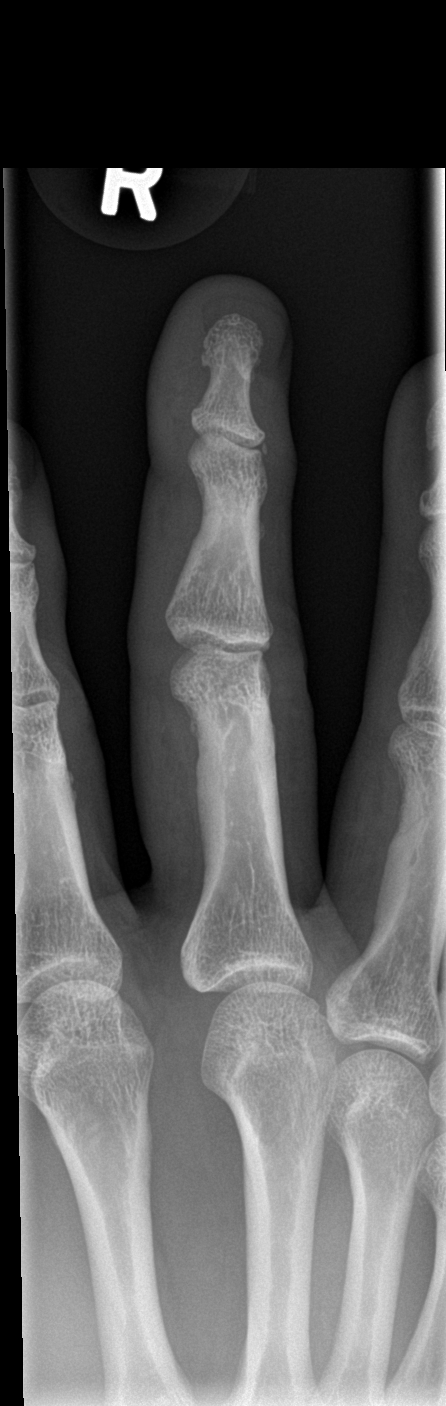

[finger lat]
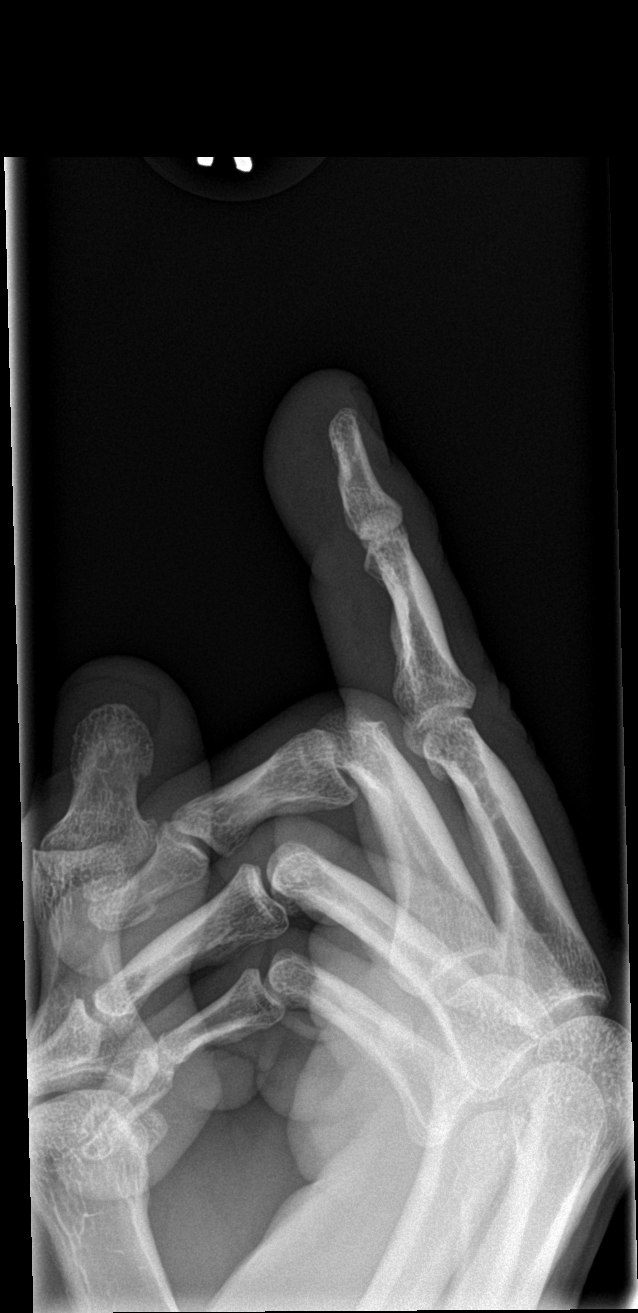

[3 of 3 positions shown; findings below may reference images not displayed]

FINDINGS: Tiny osseous density noted at the margin of the right third DIP
joint. This demonstrates fairly smooth margins, and is favored to be
chronic in nature, although it possible small avulsion injury is not
entirely excluded. No other acute fracture or dislocation. Osseous
mineralization normal. No radiopaque foreign body. No acute soft
tissue abnormality.
IMPRESSION: 1. Tiny osseous density at the margin of the right third DIP joint.
This is favored to be chronic in nature, although a possible small
avulsion injury is not entirely excluded, and could be considered in
the correct clinical setting.
2. No other radiographic evidence for acute trauma about the right
third digit. No radiopaque foreign body.

## 2018-03-19 ENCOUNTER — Other Ambulatory Visit: Payer: Self-pay

## 2018-03-19 ENCOUNTER — Emergency Department (HOSPITAL_COMMUNITY)
Admission: EM | Admit: 2018-03-19 | Discharge: 2018-03-19 | Disposition: A | Discharge status: Home / Self Care | Payer: Managed Care, Other (non HMO) | Attending: Emergency Medicine | Admitting: Emergency Medicine

## 2018-03-19 ENCOUNTER — Emergency Department (HOSPITAL_COMMUNITY): Payer: Managed Care, Other (non HMO)

## 2018-03-19 DIAGNOSIS — F17210 Nicotine dependence, cigarettes, uncomplicated: Secondary | ICD-10-CM | POA: Insufficient documentation

## 2018-03-19 DIAGNOSIS — J111 Influenza due to unidentified influenza virus with other respiratory manifestations: Secondary | ICD-10-CM | POA: Diagnosis not present

## 2018-03-19 DIAGNOSIS — Z79899 Other long term (current) drug therapy: Secondary | ICD-10-CM | POA: Insufficient documentation

## 2018-03-19 DIAGNOSIS — R69 Illness, unspecified: Secondary | ICD-10-CM

## 2018-03-19 DIAGNOSIS — R509 Fever, unspecified: Secondary | ICD-10-CM | POA: Diagnosis present

## 2018-03-19 MED ORDER — OSELTAMIVIR PHOSPHATE 75 MG PO CAPS
75.0000 mg | ORAL_CAPSULE | Freq: Once | ORAL | Status: AC
  Administered 2018-03-19: 75 mg via ORAL
  Filled 2018-03-19: qty 1

## 2018-03-19 MED ORDER — OSELTAMIVIR PHOSPHATE 75 MG PO CAPS: 75.0000 mg | ORAL_CAPSULE | Freq: Two times a day (BID) | ORAL | 0 refills | Status: DC

## 2018-03-19 MED ORDER — IBUPROFEN 400 MG PO TABS
400.0000 mg | ORAL_TABLET | Freq: Once | ORAL | Status: AC
  Administered 2018-03-19: 400 mg via ORAL
  Filled 2018-03-19: qty 1

## 2018-03-19 MED ORDER — ACETAMINOPHEN 500 MG PO TABS
ORAL_TABLET | ORAL | Status: AC
  Administered 2018-03-19: 1000 mg
  Filled 2018-03-19: qty 2

## 2018-03-19 NOTE — Discharge Instructions (Addendum)
We suspect influenza.  Keep yourself hydrated.  Alternate Tylenol and ibuprofen as needed for aches and fevers.  Follow-up with your doctor.  Return to the ED if you develop new or worsening symptoms.

## 2018-03-19 NOTE — ED Notes (Signed)
Patient drinking oral fluids at this time

## 2018-03-19 NOTE — ED Notes (Signed)
Patient refusing chest x-ray

## 2018-03-19 NOTE — ED Triage Notes (Signed)
Fever, chills, body aches onset yesterday, has not taken any fever reducer

## 2018-03-19 NOTE — ED Provider Notes (Signed)
[facility] EMERGENCY DEPARTMENT Provider Note   CSN: [id number] Arrival date & time: 03/19/18  0402     History   Chief Complaint Chief Complaint  Patient presents with  . Fever    HPI Justin Skinner is a 53 y.o. male.  Patient with history of tobacco abuse presenting with a 1 day history of body aches, chills, headache, congestion, cough.  Did not check his temperature at home or take any fever reducer.  Febrile to 102 on arrival.  Reports multiple sick contacts at work.  Cough is nonproductive.  Associated with congestion, sore throat, headache and body aches and chills.  No nausea, vomiting or diarrhea.  No chest pain or shortness of breath.  Did not receive a flu shot.  No out of the country travel.  The history is provided by the patient.  Fever  Associated symptoms: congestion, cough, headaches, myalgias, rhinorrhea and sore throat   Associated symptoms: no chest pain, no dysuria, no nausea, no rash and no vomiting     No past medical history on file.  There are no active problems to display for this patient.   Past Surgical History:  Procedure Laterality Date  . HIP SURGERY    . HIP SURGERY    . TONSILLECTOMY          Home Medications    Prior to Admission medications   Medication Sig Start Date End Date Taking? Authorizing Provider  acetaminophen (TYLENOL) 500 MG tablet Take 500 mg by mouth every 6 (six) hours as needed for mild pain or moderate pain.    [provider]  benzonatate (TESSALON) 100 MG capsule Take 1 capsule (100 mg total) by mouth every 8 (eight) hours. 03/26/15   Rolland Porter, MD  doxycycline (VIBRAMYCIN) 100 MG capsule Take 1 capsule (100 mg total) by mouth 2 (two) times daily. 09/13/15   Ivery Quale, PA-C  HYDROcodone-acetaminophen (NORCO/VICODIN) 5-325 MG tablet Take 1 tablet by mouth every 4 (four) hours as needed. 09/13/15   Ivery Quale, PA-C  naproxen (NAPROSYN) 500 MG tablet Take 1 tablet (500 mg total) by mouth 2 (two)  times daily. 03/26/15   Rolland Porter, MD  ondansetron (ZOFRAN ODT) 4 MG disintegrating tablet Take 1 tablet (4 mg total) by mouth every 8 (eight) hours as needed for nausea. 03/26/15   Rolland Porter, MD    Family History Family History  Problem Relation Age of Onset  . Diabetes Father   . Hypertension Father   . Cancer Other     Social History Social History   Tobacco Use  . Smoking status: Current Some Day Smoker    Packs/day: 1.00    Years: 25.00    Pack years: 25.00    Types: Cigarettes    Last attempt to quit: 09/16/2012    Years since quitting: 5.5  . Smokeless tobacco: Never Used  Substance Use Topics  . Alcohol use: No  . Drug use: No     Allergies   Penicillins   Review of Systems Review of Systems  Constitutional: Positive for activity change, appetite change and fever.  HENT: Positive for congestion, rhinorrhea and sore throat.   Respiratory: Positive for cough. Negative for shortness of breath.   Cardiovascular: Negative for chest pain.  Gastrointestinal: Negative for abdominal pain, nausea and vomiting.  Genitourinary: Negative for dysuria and hematuria.  Musculoskeletal: Positive for arthralgias and myalgias.  Skin: Negative for rash.  Neurological: Positive for weakness and headaches.   all other systems are  negative except as noted in the HPI and PMH.     Physical Exam Updated Vital Signs BP 124/66 (BP Location: Right Arm)   Pulse (!) 107   Temp (!) 102.1 F (38.9 C) (Oral)   Resp 18   Ht 6\' 3"  (1.905 m)   Wt 106.6 kg   SpO2 100%   BMI 29.37 kg/m   Physical Exam Vitals signs and nursing note reviewed.  Constitutional:      General: He is not in acute distress.    Appearance: He is well-developed. He is not toxic-appearing.  HENT:     Head: Normocephalic and atraumatic.     Right Ear: Tympanic membrane normal.     Left Ear: Tympanic membrane and ear canal normal.     Nose: Congestion present.     Mouth/Throat:     Mouth: Mucous  membranes are moist.     Pharynx: No oropharyngeal exudate.  Eyes:     Conjunctiva/sclera: Conjunctivae normal.     Pupils: Pupils are equal, round, and reactive to light.  Neck:     Musculoskeletal: Normal range of motion and neck supple.     Comments: No meningismus. Cardiovascular:     Rate and Rhythm: Normal rate and regular rhythm.     Heart sounds: Normal heart sounds. No murmur.  Pulmonary:     Effort: Pulmonary effort is normal. No respiratory distress.     Breath sounds: Normal breath sounds. No wheezing.  Abdominal:     Palpations: Abdomen is soft.     Tenderness: There is no abdominal tenderness. There is no guarding or rebound.  Musculoskeletal: Normal range of motion.        General: No tenderness.  Skin:    General: Skin is warm.  Neurological:     Mental Status: He is alert and oriented to person, place, and time.     Cranial Nerves: No cranial nerve deficit.     Motor: No abnormal muscle tone.     Coordination: Coordination normal.     Comments: No ataxia on finger to nose bilaterally. No pronator drift. 5/5 strength throughout. CN 2-12 intact.Equal grip strength. Sensation intact.   Psychiatric:        Behavior: Behavior normal.      ED Treatments / Results  Labs (all labs ordered are listed, but only abnormal results are displayed) Labs Reviewed - No data to display  EKG None  Radiology No results found.  Procedures Procedures (including critical care time)  Medications Ordered in ED Medications  ibuprofen (ADVIL,MOTRIN) tablet 400 mg (has no administration in time range)  oseltamivir (TAMIFLU) capsule 75 mg (has no administration in time range)  acetaminophen (TYLENOL) 500 MG tablet (1,000 mg  Given 03/19/18 0519)     Initial Impression / Assessment and Plan / ED Course  I have reviewed the triage vital signs and the nursing notes.  Pertinent labs & imaging results that were available during my care of the patient were reviewed by me and  considered in my medical decision making (see chart for details).    16 hours of chills, body aches, headache and congestion.  Suspect influenza.  No hypoxia or increased work of breathing.  Patient nontoxic-appearing.  No meningismus.  We will treat empirically for influenza with Tamiflu, antipyretics and p.o. hydration.  Patient declines chest x-ray.  Discussed oral hydration at home, antipyretics, PCP follow-up.  Return precautions discussed. Final Clinical Impressions(s) / ED Diagnoses   Final diagnoses:  Influenza-like illness  ED Discharge Orders    None       Najir Roop, Jeannett Senior, MD 03/19/18 [phone]

## 2018-07-16 ENCOUNTER — Encounter (HOSPITAL_COMMUNITY): Payer: Self-pay | Admitting: Emergency Medicine

## 2018-07-16 ENCOUNTER — Emergency Department (HOSPITAL_COMMUNITY)
Admission: EM | Admit: 2018-07-16 | Discharge: 2018-07-16 | Disposition: A | Discharge status: Home / Self Care | Payer: Managed Care, Other (non HMO) | Attending: Emergency Medicine | Admitting: Emergency Medicine

## 2018-07-16 ENCOUNTER — Other Ambulatory Visit: Payer: Self-pay

## 2018-07-16 DIAGNOSIS — F4329 Adjustment disorder with other symptoms: Secondary | ICD-10-CM | POA: Diagnosis not present

## 2018-07-16 DIAGNOSIS — Z0489 Encounter for examination and observation for other specified reasons: Secondary | ICD-10-CM | POA: Diagnosis present

## 2018-07-16 DIAGNOSIS — Z630 Problems in relationship with spouse or partner: Secondary | ICD-10-CM | POA: Diagnosis not present

## 2018-07-16 DIAGNOSIS — F17210 Nicotine dependence, cigarettes, uncomplicated: Secondary | ICD-10-CM | POA: Diagnosis not present

## 2018-07-16 NOTE — Progress Notes (Signed)
Patient ID: Justin Skinner, male   DOB: 01-Jun-1965, 53 y.o.   MRN: [id number]  Reassessment   Mr. Caamano is a 53 year old male who was required psychiatric assessment after he presented to [facility] with suicidal ideation.  Per patient report, he and his wife are in the process of separating. He states that he had plans this weekend to reconcile their differences although he wife did not seem as though she wanted to. Reports that he made a statement to his stepdaughter today stating he did not know what he would do without his wife. Reports he never intended this to mean that he would harm himself. Reports he is not suicidal and denies any thoughts of wanting to harm others. He states his statement was just over-exaggerated by his stepdaughter. He denies any previous psychiatric history or SA. He denies AVH or other psychosis. He does admitted to having  guns for hunting in the house, but states that, "I have had those guns forever. If I wanted to kill myself, I would've just grabbed the gun and did it. He does state that his guns are locked up in the gun safe. We discussed further safety of guns.    TTS did speak with patients wife (please see TTS note). Based off patients denial of SA and collateral from patients wife he is being psychiatrically cleared with safety plan discussed.

## 2018-07-16 NOTE — Discharge Instructions (Addendum)
You may want to consider marital counseling for both you and your wife as you work your way through your relationship.  I have provided some local counseling services that may be of assistance.

## 2018-07-16 NOTE — ED Triage Notes (Signed)
Pt states that he was brought in by the pd because his family thought he was going to harm himself but he denies SI and HI

## 2018-07-16 NOTE — BH Assessment (Signed)
Tele Assessment Note   Patient Name: Justin Skinner MRN: [id number] Referring Physician: Eber HongBrian Miller Location of Patient: [facility] Location of Provider: Behavioral Health TTS Department  Justin RiffleJesse V Winkowski is an 53 y.o. male who presents to [facility] with suicidal ideation.  Patient states, "this is all a misunderstanding."  Patient states that he and his wife are in the process of separating and he states that he made a statement to his stepdaughter today that he did not know how he was going to live without her.  He states that his wife has problems in the marriage because he does not spend enough time with her.  He states that he took this week off from work to work on his marriage, but he states that his wife called today and wanted him out of the house prior to her arrival home.  He states that his wife is currently under a lot of stress. Patient states that he has no history of depression or mental health issues and states that he has never had ay thoughts of hurting himself or others.  He denies HI/Psychosisi and states that he he does not use any alcohol or drugs.  Patient states that he has sleep disturbance only sleeping 3-4 hours per day, but states that it is because he works the night shift.  He states that his appetite is good.  Patient states that he has guns for hunting in the house, but states that, "I would never shoot or kill myself, that is not going to help anything."  Patient states that his guns are locked up in the gun safe.  He states that he made no statements today that he was going to try to hurt himself and does not feel like he needs to be hospitalized.    Patient presented as alert and oriented.  His mood is sad concerning his marriage, but patient's eye contact is good, his speech is clear and coherent.  Patient is contracting for safety.  His judgment, insight and impulse control appear to be intact.  He does not appear to be responding to any internal stimuli.  TTS spoke to  patient's wife, [name] who states that she is divorcing her husband and she states that he is not taking it well.  She states that patient has never tired to hurt himself in the past and has no history of mental illness or treatment.  She states that she had him brought to the hospital because he made the statement that he could not live without her and it scared her to hear him say that.  She states that he never identified a plan of how he would harm himself.  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 No pertinent past medical history.  Past Surgical History:  Procedure Laterality Date  . HIP SURGERY    . HIP SURGERY    . TONSILLECTOMY      Family History:  Family History  Problem Relation Age of Onset  . Diabetes Father   . Hypertension Father   . Cancer Other     Social History:  reports that he has been smoking cigarettes. He has a 25.00 pack-year smoking history. He has never used smokeless tobacco. He reports that he does not drink alcohol or use drugs.  Additional Social History:  Alcohol / Drug Use Pain Medications: see MAR Prescriptions: see MAR Over the Counter: see MAR History of alcohol / drug use?: No history of  alcohol / drug abuse Longest period of sobriety (when/how long): N/A  CIWA: CIWA-Ar BP: 107/78 Pulse Rate: 83 COWS:    Allergies:  Allergies  Allergen Reactions  . Penicillins Other (See Comments)    Has patient had a PCN reaction causing immediate rash, facial/tongue/throat swelling, SOB or lightheadedness with hypotension: No Has patient had a PCN reaction causing severe rash involving mucus membranes or skin necrosis: No Has patient had a PCN reaction that required hospitalization Yes Has patient had a PCN reaction occurring within the last 10 years: No If all of the above answers are "NO", then may proceed with Cephalosporin use.    Patient was hospitalized---almost  died-high fever    Home Medications: (Not in a hospital admission)   OB/GYN Status:  No LMP for male patient.  General Assessment Data TTS Assessment: In system Is this a Tele or Face-to-Face Assessment?: Tele Assessment Is this an Initial Assessment or a Re-assessment for this encounter?: Initial Assessment Patient Accompanied by:: N/A Language Other than English: No Living Arrangements: Other (Comment)(lives with wife) What gender do you identify as?: Male Marital status: Married Living Arrangements: Spouse/significant other Can pt return to current living arrangement?: No Admission Status: Voluntary Is patient capable of signing voluntary admission?: Yes Referral Source: Self/Family/Friend Insurance type: [place] Living Arrangements: Spouse/significant other Legal Guardian: Other:(self) Name of Psychiatrist: none Name of Therapist: none     Risk to self with the past 6 months Suicidal Ideation: No Has patient been a risk to self within the past 6 months prior to admission? : No Suicidal Intent: No Has patient had any suicidal intent within the past 6 months prior to admission? : No Is patient at risk for suicide?: No Suicidal Plan?: No Has patient had any suicidal plan within the past 6 months prior to admission? : No Access to Means: No What has been your use of drugs/alcohol within the last 12 months?: none Previous Attempts/Gestures: No How many times?: 0 Other Self Harm Risks: none Triggers for Past Attempts: None known Intentional Self Injurious Behavior: None Family Suicide History: No Recent stressful life event(s): Divorce Persecutory voices/beliefs?: No Depression: No Substance abuse history and/or treatment for substance abuse?: No Suicide prevention information given to non-admitted patients: Not applicable  Risk to Others within the past 6 months Homicidal Ideation: No Does patient have any lifetime risk of violence toward  others beyond the six months prior to admission? : No Thoughts of Harm to Others: No Current Homicidal Intent: No Current Homicidal Plan: No Access to Homicidal Means: No Identified Victim: none History of harm to others?: No Assessment of Violence: None Noted Violent Behavior Description: none Does patient have access to weapons?: No Criminal Charges Pending?: No Does patient have a court date: No Is patient on probation?: No  Psychosis Hallucinations: None noted Delusions: None noted  Mental Status Report Appearance/Hygiene: Unremarkable Eye Contact: Good Motor Activity: Unremarkable Speech: Logical/coherent Level of Consciousness: Alert Mood: Apathetic Affect: Appropriate to circumstance Anxiety Level: None Thought Processes: Coherent, Relevant Judgement: Unimpaired Orientation: Person, Place, Time, Situation Obsessive Compulsive Thoughts/Behaviors: None  Cognitive Functioning Concentration: Normal Memory: Recent Intact, Remote Intact Is patient IDD: No Insight: Good Impulse Control: Good Appetite: Good Have you had any weight changes? : No Change Sleep: Decreased Total Hours of Sleep: 4 Vegetative Symptoms: None  ADLScreening [facility] Assessment Services) Patient's cognitive ability adequate to safely complete daily activities?: Yes Patient able to express need for assistance with ADLs?: Yes Independently  performs ADLs?: Yes (appropriate for developmental age)  Prior Inpatient Therapy Prior Inpatient Therapy: No  Prior Outpatient Therapy Prior Outpatient Therapy: No Does patient have an ACCT team?: No Does patient have Intensive In-House Services?  : No Does patient have [facility] services? : No Does patient have P4CC services?: No  ADL Screening (condition at time of admission) Patient's cognitive ability adequate to safely complete daily activities?: Yes Is the patient deaf or have difficulty hearing?: No Does the patient have difficulty seeing, even when  wearing glasses/contacts?: No Does the patient have difficulty concentrating, remembering, or making decisions?: No Patient able to express need for assistance with ADLs?: Yes Does the patient have difficulty dressing or bathing?: No Independently performs ADLs?: Yes (appropriate for developmental age) Does the patient have difficulty walking or climbing stairs?: No Weakness of Legs: None Weakness of Arms/Hands: None  Home Assistive Devices/Equipment Home Assistive Devices/Equipment: None  Therapy Consults (therapy consults require a physician order) PT Evaluation Needed: No OT Evalulation Needed: No SLP Evaluation Needed: No Abuse/Neglect Assessment (Assessment to be complete while patient is alone) Abuse/Neglect Assessment Can Be Completed: Yes Physical Abuse: Denies Verbal Abuse: Denies Sexual Abuse: Denies Exploitation of patient/patient's resources: Denies Self-Neglect: Denies Values / Beliefs Cultural Requests During Hospitalization: None Spiritual Requests During Hospitalization: None Consults Spiritual Care Consult Needed: No Social Work Consult Needed: No Merchant navy officerAdvance Directives (For Healthcare) Does Patient Have a Medical Advance Directive?: No Would patient like information on creating a medical advance directive?: No - Patient declined          Disposition: Patient has been psych-cleared by Denzil MagnusonLaShunda Thomas, NP Disposition Initial Assessment Completed for this Encounter: Yes  This service was provided via telemedicine using a 2-way, interactive audio and video technology.  Names of all persons participating in this telemedicine service and their role in this encounter. Name: Dellis AnesJesse Bear Role: patient  Name: Dannielle Huhanny Chonda Baney Role: TTS  Name: Role:   Name:  Role:     Daphene CalamityDanny J Batool Majid 07/16/2018 12:39 PM

## 2018-07-16 NOTE — ED Provider Notes (Signed)
[facility] EMERGENCY DEPARTMENT Provider Note   CSN: [id number] Arrival date & time: 07/16/18  1116    History   Chief Complaint Chief Complaint  Patient presents with  . Suicidal    HPI Justin Skinner is a 53 y.o. male with no significant past medical history presenting for evaluation of concern for possible suicidal ideation.  Patient denies this allegation but is here voluntarily.  He describes having marital difficulties and he and his wife are currently trying to work out their differences.  He describes being very focused on work and build pain recently and recognizes he has been less supportive of her.  She has been contemplating separation and the patient has taken a week off of work in order to try to work things out with her.  This morning the stepdaughter was at the home and he made the comment that he loves his wife and cannot live without her.  She overreacted, called her mother who was not at home who promptly called the police and was transported here.  He adamantly denies homicidal or suicidal ideation he is simply desirous of working out his marriage.  He denies prior history of depression or suicidal ideation.    The history is provided by the patient.    History reviewed. No pertinent past medical history.  There are no active problems to display for this patient.   Past Surgical History:  Procedure Laterality Date  . HIP SURGERY    . HIP SURGERY    . TONSILLECTOMY          Home Medications    Prior to Admission medications   Not on File    Family History Family History  Problem Relation Age of Onset  . Diabetes Father   . Hypertension Father   . Cancer Other     Social History Social History   Tobacco Use  . Smoking status: Current Some Day Smoker    Packs/day: 1.00    Years: 25.00    Pack years: 25.00    Types: Cigarettes    Last attempt to quit: 09/16/2012    Years since quitting: 5.8  . Smokeless tobacco: Never Used  Substance Use  Topics  . Alcohol use: No  . Drug use: No     Allergies   Penicillins   Review of Systems Review of Systems  Constitutional: Negative.   HENT: Negative.   Eyes: Negative.   Respiratory: Negative.  Negative for shortness of breath.   Cardiovascular: Negative.  Negative for chest pain.  Gastrointestinal: Negative.   Genitourinary: Negative.   Neurological: Negative.   Psychiatric/Behavioral: Negative.  Negative for self-injury and suicidal ideas.     Physical Exam Updated Vital Signs BP 107/78 (BP Location: Right Arm)   Pulse 83   Temp 98 F (36.7 C) (Oral)   Resp 16   Ht 6\' 3"  (1.905 m)   Wt 106.6 kg   SpO2 99%   BMI 29.37 kg/m   Physical Exam Vitals signs and nursing note reviewed.  Constitutional:      Appearance: He is well-developed.  HENT:     Head: Normocephalic and atraumatic.  Eyes:     Conjunctiva/sclera: Conjunctivae normal.  Cardiovascular:     Rate and Rhythm: Normal rate.  Pulmonary:     Effort: Pulmonary effort is normal.     Breath sounds: No wheezing.  Musculoskeletal: Normal range of motion.  Skin:    General: Skin is warm and dry.  Neurological:  General: No focal deficit present.     Mental Status: He is alert.  Psychiatric:        Attention and Perception: Attention normal.        Mood and Affect: Mood normal.        Speech: Speech normal.        Behavior: Behavior normal.        Thought Content: Thought content normal. Thought content does not include homicidal or suicidal ideation. Thought content does not include homicidal or suicidal plan.      ED Treatments / Results  Labs (all labs ordered are listed, but only abnormal results are displayed) Labs Reviewed - No data to display  EKG None  Radiology No results found.  Procedures Procedures (including critical care time)  Medications Ordered in ED Medications - No data to display   Initial Impression / Assessment and Plan / ED Course  I have reviewed the  triage vital signs and the nursing notes.  Pertinent labs & imaging results that were available during my care of the patient were reviewed by me and considered in my medical decision making (see chart for details).        Pt evaluated by TTS and agrees with patient not being suicidal/homicidal as was my initial impression.  He was given resource guide for clinics that may offer marital counseling.  Prn f/u anticipated.  Final Clinical Impressions(s) / ED Diagnoses   Final diagnoses:  Marital conflict    ED Discharge Orders    None       Victoriano Laindol, Davieon Stockham, PA-C 07/16/18 1415    Eber HongMiller, Brian, MD 07/17/18 [phone]

## 2022-08-31 ENCOUNTER — Ambulatory Visit: Payer: Self-pay | Admitting: Nurse Practitioner

## 2022-09-02 ENCOUNTER — Ambulatory Visit: Payer: Self-pay | Admitting: Family Medicine

## 2022-09-08 ENCOUNTER — Ambulatory Visit: Payer: Self-pay | Admitting: Nurse Practitioner

## 2022-10-03 DIAGNOSIS — Z0000 Encounter for general adult medical examination without abnormal findings: Secondary | ICD-10-CM | POA: Insufficient documentation

## 2022-10-03 NOTE — Progress Notes (Deleted)
New Patient Office Visit  Subjective    Patient ID: Justin Skinner, male    DOB: [birth year]  Age: 57 y.o. MRN: [id number]  CC: No chief complaint on file.   HPI Justin Skinner presents to establish care ***  No outpatient encounter medications on file as of 10/06/2022.   No facility-administered encounter medications on file as of 10/06/2022.    No past medical history on file.  Past Surgical History:  Procedure Laterality Date   HIP SURGERY     HIP SURGERY     TONSILLECTOMY      Family History  Problem Relation Age of Onset   Diabetes Father    Hypertension Father    Cancer Other     Social History   Socioeconomic History   Marital status: Married    Spouse name: Not on file   Number of children: Not on file   Years of education: Not on file   Highest education level: Not on file  Occupational History   Not on file  Tobacco Use   Smoking status: Some Days    Current packs/day: 0.00    Average packs/day: 1 pack/day for 25.0 years (25.0 ttl pk-yrs)    Types: Cigarettes    Start date: 09/17/1987    Last attempt to quit: 09/16/2012    Years since quitting: 10.0   Smokeless tobacco: Never  Substance and Sexual Activity   Alcohol use: No   Drug use: No   Sexual activity: Not on file  Other Topics Concern   Not on file  Social History Narrative   Not on file   Social Determinants of Health   Financial Resource Strain: Not on file  Food Insecurity: Not on file  Transportation Needs: Not on file  Physical Activity: Not on file  Stress: Not on file  Social Connections: Not on file  Intimate Partner Violence: Not on file    ROS Negative unless indicated in HPI   Objective    There were no vitals taken for this visit.  Physical Exam  Last CBC Lab Results  Component Value Date   WBC 3.8 (L) 03/25/2015   HGB 16.3 03/25/2015   HCT 46.8 03/25/2015   MCV 93.4 03/25/2015   MCH 32.5 03/25/2015   RDW 12.8 03/25/2015   PLT 134 (L) 03/25/2015   Last  metabolic panel Lab Results  Component Value Date   GLUCOSE 103 (H) 03/25/2015   NA 139 03/25/2015   K 4.0 03/25/2015   CL 105 03/25/2015   CO2 27 03/25/2015   BUN 12 03/25/2015   CREATININE 1.32 (H) 03/25/2015   GFRNONAA >60 03/25/2015   CALCIUM 8.5 (L) 03/25/2015   PROT 6.9 03/25/2015   ALBUMIN 4.2 03/25/2015   BILITOT 0.6 03/25/2015   ALKPHOS 75 03/25/2015   AST 32 03/25/2015   ALT 49 03/25/2015   ANIONGAP 7 03/25/2015      Assessment & Plan:  There are no diagnoses linked to this encounter.  No follow-ups on file.   @Justin Skinner  Janee Morn DNP@

## 2022-10-06 ENCOUNTER — Ambulatory Visit: Payer: Self-pay | Admitting: Nurse Practitioner

## 2022-10-06 DIAGNOSIS — Z0000 Encounter for general adult medical examination without abnormal findings: Secondary | ICD-10-CM

## 2022-11-16 ENCOUNTER — Encounter: Payer: Self-pay | Admitting: Nurse Practitioner

## 2022-11-16 ENCOUNTER — Ambulatory Visit: Payer: 59 | Admitting: Nurse Practitioner

## 2022-11-16 VITALS — BP 112/68 | HR 74 | Temp 97.7°F | Ht 75.0 in | Wt 241.6 lb

## 2022-11-16 DIAGNOSIS — Z125 Encounter for screening for malignant neoplasm of prostate: Secondary | ICD-10-CM | POA: Diagnosis not present

## 2022-11-16 DIAGNOSIS — Z8249 Family history of ischemic heart disease and other diseases of the circulatory system: Secondary | ICD-10-CM

## 2022-11-16 DIAGNOSIS — F17200 Nicotine dependence, unspecified, uncomplicated: Secondary | ICD-10-CM | POA: Insufficient documentation

## 2022-11-16 DIAGNOSIS — L219 Seborrheic dermatitis, unspecified: Secondary | ICD-10-CM

## 2022-11-16 DIAGNOSIS — L02416 Cutaneous abscess of left lower limb: Secondary | ICD-10-CM

## 2022-11-16 DIAGNOSIS — Z823 Family history of stroke: Secondary | ICD-10-CM | POA: Insufficient documentation

## 2022-11-16 DIAGNOSIS — Z716 Tobacco abuse counseling: Secondary | ICD-10-CM | POA: Insufficient documentation

## 2022-11-16 DIAGNOSIS — E6609 Other obesity due to excess calories: Secondary | ICD-10-CM

## 2022-11-16 DIAGNOSIS — Z0000 Encounter for general adult medical examination without abnormal findings: Secondary | ICD-10-CM | POA: Insufficient documentation

## 2022-11-16 DIAGNOSIS — Z0001 Encounter for general adult medical examination with abnormal findings: Secondary | ICD-10-CM | POA: Insufficient documentation

## 2022-11-16 DIAGNOSIS — E66811 Obesity, class 1: Secondary | ICD-10-CM | POA: Diagnosis not present

## 2022-11-16 DIAGNOSIS — L03116 Cellulitis of left lower limb: Secondary | ICD-10-CM

## 2022-11-16 DIAGNOSIS — Z720 Tobacco use: Secondary | ICD-10-CM | POA: Insufficient documentation

## 2022-11-16 DIAGNOSIS — Z6830 Body mass index (BMI) 30.0-30.9, adult: Secondary | ICD-10-CM

## 2022-11-16 DIAGNOSIS — Z122 Encounter for screening for malignant neoplasm of respiratory organs: Secondary | ICD-10-CM

## 2022-11-16 LAB — LIPID PANEL

## 2022-11-16 MED ORDER — TRIAMCINOLONE ACETONIDE 0.5 % EX CREA: 1.0000 | TOPICAL_CREAM | Freq: Three times a day (TID) | CUTANEOUS | 0 refills | Status: AC

## 2022-11-16 MED ORDER — SULFAMETHOXAZOLE-TRIMETHOPRIM 400-80 MG PO TABS: 1.0000 | ORAL_TABLET | Freq: Two times a day (BID) | ORAL | 0 refills | Status: AC

## 2022-11-16 NOTE — Progress Notes (Signed)
New Patient Office Visit  Subjective    Patient ID: Justin Skinner, male    DOB: [birth year]  Age: 58 y.o. MRN: [id number]  CC:  Chief Complaint  Patient presents with   Establish Care   spot on knee    Spot on right knee and left calve that has been there for over a year, very itchy    Blurred Vision    About 3 weeks ago had an incident at work were everything went blurry has not happened since. Checked vitals at work everything was normal    HPI Justin Skinner presents to establish care and concerns fro rash on right knee and left lower leg Family history of CVA and cardiac issues father The patient is a 57 year old male establishing care today. He reports concerns regarding a rash on his knee and another rash on his lower right leg, both of which have been warm to the touch for several weeks. The rashes have not improved despite his use of over-the-counter cream, which he states was initially somewhat helpful but ultimately ineffective. He is currently not taking any medications and has not sought further treatment for these rashes. The patient expresses a desire for evaluation and effective management of his skin concerns.  Smokes 1-pack daily fro 40 yrs  Family hx of CVA  and cardiac father  Outpatient Encounter Medications as of 11/16/2022  Medication Sig   sulfamethoxazole-trimethoprim (BACTRIM) 400-80 MG tablet Take 1 tablet by mouth 2 (two) times daily.   triamcinolone cream (KENALOG) 0.5 % Apply 1 Application topically 3 (three) times daily.   No facility-administered encounter medications on file as of 11/16/2022.    History reviewed. No pertinent past medical history.  Past Surgical History:  Procedure Laterality Date   HIP SURGERY     HIP SURGERY     TONSILLECTOMY      Family History  Problem Relation Age of Onset   Diabetes Father    Hypertension Father    Cancer Other     Social History   Socioeconomic History   Marital status: Married    Spouse  name: Not on file   Number of children: Not on file   Years of education: Not on file   Highest education level: Not on file  Occupational History   Not on file  Tobacco Use   Smoking status: Every Day    Current packs/day: 1.00    Average packs/day: 1 pack/day for 35.2 years (35.2 ttl pk-yrs)    Types: Cigarettes    Start date: 09/17/1987    Last attempt to quit: 09/16/2012   Smokeless tobacco: Never  Vaping Use   Vaping status: Never Used  Substance and Sexual Activity   Alcohol use: No   Drug use: No   Sexual activity: Not on file  Other Topics Concern   Not on file  Social History Narrative   Not on file   Social Determinants of Health   Financial Resource Strain: Low Risk  (11/16/2022)   Overall Financial Resource Strain (CARDIA)    Difficulty of Paying Living Expenses: Not very hard  Food Insecurity: No Food Insecurity (11/16/2022)   Hunger Vital Sign    Worried About Running Out of Food in the Last Year: Never true    Ran Out of Food in the Last Year: Never true  Transportation Needs: No Transportation Needs (11/16/2022)   PRAPARE - Administrator, Civil Service (Medical): No    Lack of Transportation (  Non-Medical): No  Physical Activity: Inactive (11/16/2022)   Exercise Vital Sign    Days of Exercise per Week: 0 days    Minutes of Exercise per Session: 0 min  Stress: Not on file  Social Connections: Not on file  Intimate Partner Violence: Not At Risk (11/16/2022)   Humiliation, Afraid, Rape, and Kick questionnaire    Fear of Current or Ex-Partner: No    Emotionally Abused: No    Physically Abused: No    Sexually Abused: No    Review of Systems  Constitutional:  Negative for chills and fever.  HENT:  Positive for ear pain and sore throat.   Eyes:  Negative for pain and redness.  Respiratory:  Negative for cough and shortness of breath.   Cardiovascular:  Negative for chest pain and leg swelling.  Gastrointestinal:  Negative for blood in stool,  nausea and vomiting.  Genitourinary:  Negative for flank pain.  Musculoskeletal:  Negative for joint pain and neck pain.  Skin:  Positive for itching and rash.  Neurological:  Negative for dizziness and headaches.  Endo/Heme/Allergies:  Negative for environmental allergies and polydipsia. Does not bruise/bleed easily.  Psychiatric/Behavioral:  Negative for suicidal ideas. The patient does not have insomnia.    Negative unless indicated in HPI   Objective    BP 112/68   Pulse 74   Temp 97.7 F (36.5 C) (Temporal)   Ht 6\' 3"  (1.905 m)   Wt 241 lb 9.6 oz (109.6 kg)   SpO2 95%   BMI 30.20 kg/m   Physical Exam Vitals and nursing note reviewed.  Constitutional:      Appearance: Normal appearance.  HENT:     Head: Normocephalic and atraumatic.     Right Ear: Tympanic membrane, ear canal and external ear normal. There is no impacted cerumen.     Left Ear: Tympanic membrane, ear canal and external ear normal. There is no impacted cerumen.     Nose: No congestion or rhinorrhea.  Eyes:     General: No scleral icterus.    Conjunctiva/sclera: Conjunctivae normal.     Pupils: Pupils are equal, round, and reactive to light.  Cardiovascular:     Rate and Rhythm: Normal rate and regular rhythm.  Pulmonary:     Effort: Pulmonary effort is normal.     Breath sounds: Normal breath sounds.  Abdominal:     General: Bowel sounds are normal.     Palpations: Abdomen is soft. There is no mass.     Tenderness: There is no abdominal tenderness.     Hernia: No hernia is present.  Musculoskeletal:        General: Normal range of motion.     Right lower leg: No edema.     Left lower leg: No edema.  Skin:    General: Skin is warm and dry.     Findings: Rash present. Rash is scaling.       Neurological:     Mental Status: He is alert and oriented to person, place, and time. Mental status is at baseline.  Psychiatric:        Mood and Affect: Mood normal.        Behavior: Behavior normal.         Thought Content: Thought content normal.        Judgment: Judgment normal.     Last CBC Lab Results  Component Value Date   WBC 3.8 (L) 03/25/2015   HGB 16.3 03/25/2015   HCT 46.8  03/25/2015   MCV 93.4 03/25/2015   MCH 32.5 03/25/2015   RDW 12.8 03/25/2015   PLT 134 (L) 03/25/2015   Last metabolic panel Lab Results  Component Value Date   GLUCOSE 103 (H) 03/25/2015   NA 139 03/25/2015   K 4.0 03/25/2015   CL 105 03/25/2015   CO2 27 03/25/2015   BUN 12 03/25/2015   CREATININE 1.32 (H) 03/25/2015   GFRNONAA >60 03/25/2015   CALCIUM 8.5 (L) 03/25/2015   PROT 6.9 03/25/2015   ALBUMIN 4.2 03/25/2015   BILITOT 0.6 03/25/2015   ALKPHOS 75 03/25/2015   AST 32 03/25/2015   ALT 49 03/25/2015   ANIONGAP 7 03/25/2015   Last thyroid functions Lab Results  Component Value Date   TSH 1.960 12/12/2013        Assessment & Plan:  Screening for prostate cancer -     PSA, total and free  Seborrheic eczema -     CBC with Differential/Platelet -     Triamcinolone Acetonide; Apply 1 Application topically 3 (three) times daily.  Dispense: 30 g; Refill: 0  Class 1 obesity due to excess calories without serious comorbidity with body mass index (BMI) of 30.0 to 30.9 in adult -     Lipid panel  Cellulitis and abscess of left leg -     CBC with Differential/Platelet -     Sulfamethoxazole-Trimethoprim; Take 1 tablet by mouth 2 (two) times daily.  Dispense: 20 tablet; Refill: 0  Screening for lung cancer -     Ambulatory referral to Pulmonology  Encounter for screening for malignant neoplasm of lung in current smoker with 30 pack year history or greater -     Ambulatory referral to Pulmonology  Nicotine abuse -     Ambulatory referral to Pulmonology  Encounter for general adult medical examination with abnormal findings -     CBC with Differential/Platelet -     CMP14+EGFR -     Lipid panel -     Thyroid Panel With TSH  Family history of cerebrovascular accident (CVA)  in father -     Ambulatory referral to Pulmonology  Encounter for smoking cessation counseling -     Ambulatory referral to Pulmonology  Family history of cardiac pacemaker  Quintus is 56 yrs old caucasian male, no acute distress Eczema: triamcinolone BID Cellulitis righ leg: Keflex BID for 10 days Labs: CBC, CMP, lipid, TSH PSA Referral to pulmonology for lin cancer screening  Encourage healthy lifestyle choices, including diet (rich in fruits, vegetables, and lean proteins, and low in salt and simple carbohydrates) and exercise (at least 30 minutes of moderate physical activity daily).     The above assessment and management plan was discussed with the patient. The patient verbalized understanding of and has agreed to the management plan. Patient is aware to call the clinic if they develop any new symptoms or if symptoms persist or worsen. Patient is aware when to return to the clinic for a follow-up visit. Patient educated on when it is appropriate to go to the emergency department.   Return in about 3 months (around 02/16/2023).   Arrie Aran Santa Lighter, DNP [facility] [address] [place], [place] [address] [phone]

## 2022-11-17 LAB — CBC WITH DIFFERENTIAL/PLATELET
Basophils Absolute: 0 10*3/uL (ref 0.0–0.2)
Basos: 0 %
EOS (ABSOLUTE): 0 10*3/uL (ref 0.0–0.4)
Eos: 1 %
Hematocrit: 49.7 % (ref 37.5–51.0)
Hemoglobin: 16.6 g/dL (ref 13.0–17.7)
Immature Grans (Abs): 0 10*3/uL (ref 0.0–0.1)
Immature Granulocytes: 0 %
Lymphocytes Absolute: 1.5 10*3/uL (ref 0.7–3.1)
Lymphs: 18 %
MCH: 31.9 pg (ref 26.6–33.0)
MCHC: 33.4 g/dL (ref 31.5–35.7)
MCV: 95 fL (ref 79–97)
Monocytes Absolute: 0.6 10*3/uL (ref 0.1–0.9)
Monocytes: 7 %
Neutrophils Absolute: 6 10*3/uL (ref 1.4–7.0)
Neutrophils: 74 %
Platelets: 203 10*3/uL (ref 150–450)
RBC: 5.21 x10E6/uL (ref 4.14–5.80)
RDW: 12.2 % (ref 11.6–15.4)
WBC: 8.2 10*3/uL (ref 3.4–10.8)

## 2022-11-17 LAB — LIPID PANEL
Cholesterol, Total: 226 mg/dL — ABNORMAL HIGH (ref 100–199)
HDL: 32 mg/dL — ABNORMAL LOW (ref >39)
LDL CALC COMMENT:: 7.1 ratio — ABNORMAL HIGH (ref 0.0–5.0)
LDL Chol Calc (NIH): 125 mg/dL — ABNORMAL HIGH (ref 0–99)
Triglycerides: 389 mg/dL — ABNORMAL HIGH (ref 0–149)
VLDL Cholesterol Cal: 69 mg/dL — ABNORMAL HIGH (ref 5–40)

## 2022-11-17 LAB — CMP14+EGFR
ALT: 26 IU/L (ref 0–44)
AST: 18 IU/L (ref 0–40)
Albumin: 4.4 g/dL (ref 3.8–4.9)
Alkaline Phosphatase: 83 IU/L (ref 44–121)
BUN/Creatinine Ratio: 12 (ref 9–20)
BUN: 15 mg/dL (ref 6–24)
Bilirubin Total: 0.4 mg/dL (ref 0.0–1.2)
CO2: 22 mmol/L (ref 20–29)
Calcium: 9.3 mg/dL (ref 8.7–10.2)
Chloride: 105 mmol/L (ref 96–106)
Creatinine, Ser: 1.26 mg/dL (ref 0.76–1.27)
Globulin, Total: 2.1 g/dL (ref 1.5–4.5)
Glucose: 72 mg/dL (ref 70–99)
Potassium: 4.3 mmol/L (ref 3.5–5.2)
Sodium: 142 mmol/L (ref 134–144)
Total Protein: 6.5 g/dL (ref 6.0–8.5)
eGFR: 67 mL/min/{1.73_m2} (ref >59)

## 2022-11-17 LAB — PSA, TOTAL AND FREE
PSA, Free Pct: 25 %
PSA, Free: 0.4 ng/mL
Prostate Specific Ag, Serum: 1.6 ng/mL (ref 0.0–4.0)

## 2022-11-17 LAB — THYROID PANEL WITH TSH
Free Thyroxine Index: 1.8 (ref 1.2–4.9)
T3 Uptake Ratio: 22 % — ABNORMAL LOW (ref 24–39)
T4, Total: 8 ug/dL (ref 4.5–12.0)
TSH: 0.912 u[IU]/mL (ref 0.450–4.500)

## 2022-11-18 ENCOUNTER — Other Ambulatory Visit: Payer: Self-pay | Admitting: Nurse Practitioner

## 2022-11-18 DIAGNOSIS — E782 Mixed hyperlipidemia: Secondary | ICD-10-CM

## 2022-11-18 MED ORDER — FENOFIBRATE 48 MG PO TABS
48.0000 mg | ORAL_TABLET | Freq: Every day | ORAL | 0 refills | Status: AC
Start: 2022-11-18 — End: ?

## 2023-02-08 ENCOUNTER — Encounter: Payer: Self-pay | Admitting: *Deleted

## 2023-03-12 NOTE — Progress Notes (Signed)
  Intake history:  BP (!) 155/108   Pulse 80   Ht 6\' 3"  (1.905 m)   Wt 189 lb (85.7 kg)   BMI 23.62 kg/m  Body mass index is 23.62 kg/m.    WHAT ARE WE SEEING YOU FOR TODAY?   right knee(s)  How long has this bothered you? (DOI?DOS?WS?)  Right since injury using tiller about 2 years ago worse since pop a couple weeks ago, has new arch supports that help   Anticoag.  No  Diabetes No  Heart disease No  Hypertension No  SMOKING HX Yes  Kidney disease No  Any ALLERGIES ______________________________________________   Treatment:  Have you taken:  Tylenol  No  Advil  No  Had PT No  Had injection No  Other  ________________BC powder new orthotics help _________

## 2023-03-15 ENCOUNTER — Encounter: Payer: Self-pay | Admitting: Orthopedic Surgery

## 2023-03-15 ENCOUNTER — Ambulatory Visit (INDEPENDENT_AMBULATORY_CARE_PROVIDER_SITE_OTHER): Payer: 59 | Admitting: Orthopedic Surgery

## 2023-03-15 ENCOUNTER — Other Ambulatory Visit (INDEPENDENT_AMBULATORY_CARE_PROVIDER_SITE_OTHER): Payer: Self-pay

## 2023-03-15 VITALS — BP 155/108 | HR 80 | Ht 75.0 in | Wt 189.0 lb

## 2023-03-15 DIAGNOSIS — G8929 Other chronic pain: Secondary | ICD-10-CM | POA: Diagnosis not present

## 2023-03-15 DIAGNOSIS — M25561 Pain in right knee: Secondary | ICD-10-CM

## 2023-03-15 DIAGNOSIS — M1712 Unilateral primary osteoarthritis, left knee: Secondary | ICD-10-CM

## 2023-03-15 DIAGNOSIS — M23204 Derangement of unspecified medial meniscus due to old tear or injury, left knee: Secondary | ICD-10-CM

## 2023-03-15 NOTE — Progress Notes (Signed)
 Office Visit Note   Patient: Justin Skinner           Date of Birth: May 18, 1965           MRN: [id number] Visit Date: 03/15/2023 Requested by: Anton Baton, NP [address] [place],  [place] [address] PCP: Anton Baton, NP   Assessment & Plan:   Encounter Diagnoses  Name Primary?   Chronic pain of right knee Yes   Other old tear of medial meniscus of left knee    Primary osteoarthritis of left knee     No orders of the defined types were placed in this encounter.   58 year old male probably has a meniscus tear which is currently not symptomatic enough to warrant surgery.  Activity modification in terms of pivoting on the right should keep his knee quiet.  No other interventions needed at this time   Subjective: Chief Complaint  Patient presents with   Knee Pain    Right since injury using tiller about 2 years ago     HPI: 58 year old male twisted his knee 2 years ago while telling his yard something popped on the medial side it eventually stopped hurting.  Over the last 2 years she has had intermittent symptoms in the right knee and then 2 weeks ago the knee popped again while twisting comes in with mild medial knee symptoms no mechanical symptoms              ROS: Negative   Images personally read and my interpretation : See dictated report  Visit Diagnoses:  1. Chronic pain of right knee   2. Other old tear of medial meniscus of left knee   3. Primary osteoarthritis of left knee      Follow-Up Instructions: Return if symptoms worsen or fail to improve.    Objective: Vital Signs: BP (!) 155/108   Pulse 80   Ht 6\' 3"  (1.905 m)   Wt 189 lb (85.7 kg)   BMI 23.62 kg/m   Physical Exam Normal gait  He is awake alert and oriented x 3  Mood and affect are normal  Extremities warm dry  Ortho Exam  Medial palpation negative for pain no tenderness laterally full range of motion  No effusion  Good quad function  Negative  McMurray's except for maybe some discomfort medially  Specialty Comments:  No specialty comments available.  Imaging: DG Knee 4 Views W/Patella Right Result Date: 03/15/2023 Image report Right knee 4 views Medial knee pain X-ray shows slight varus alignment to this right knee, asymmetric joint spaces medial slightly narrow no evidence of sclerosis of the subchondral bone or osteophyte formation Mild joint space narrowing probably consistent with mild OA right knee     PMFS History: Patient Active Problem List   Diagnosis Date Noted   Cellulitis and abscess of left leg 11/16/2022   Screening for prostate cancer 11/16/2022   Routine medical exam 11/16/2022   Encounter for general adult medical examination with abnormal findings 11/16/2022   Nicotine abuse 11/16/2022   Encounter for screening for malignant neoplasm of lung in current smoker with 30 pack year history or greater 11/16/2022   Encounter for smoking cessation counseling 11/16/2022   Family history of cerebrovascular accident (CVA) in father 11/16/2022   Seborrheic eczema 11/16/2022   Screening for lung cancer 11/16/2022   Family history of cardiac pacemaker 11/16/2022   Class 1 obesity due to excess calories without serious comorbidity with body mass index (  BMI) of 30.0 to 30.9 in adult 11/16/2022   Routine general medical examination at a health care facility 10/03/2022   History reviewed. No pertinent past medical history.  Family History  Problem Relation Age of Onset   Diabetes Father    Hypertension Father    Cancer Other     Past Surgical History:  Procedure Laterality Date   HIP SURGERY     HIP SURGERY     TONSILLECTOMY     Social History   Occupational History   Not on file  Tobacco Use   Smoking status: Every Day    Current packs/day: 1.00    Average packs/day: 1 pack/day for 35.5 years (35.5 ttl pk-yrs)    Types: Cigarettes    Start date: 09/17/1987    Last attempt to quit: 09/16/2012   Smokeless  tobacco: Never  Vaping Use   Vaping status: Never Used  Substance and Sexual Activity   Alcohol use: No   Drug use: No   Sexual activity: Not on file
